# Patient Record
Sex: Male | Born: 1965 | Race: Asian | Hispanic: No | Marital: Single | Smoking: Former smoker
Health system: Southern US, Community
[De-identification: ages and names within clinical notes are randomized; demographics above are authoritative.]

## PROBLEM LIST (undated history)

## (undated) DIAGNOSIS — E119 Type 2 diabetes mellitus without complications: Secondary | ICD-10-CM

## (undated) DIAGNOSIS — I10 Essential (primary) hypertension: Secondary | ICD-10-CM

## (undated) HISTORY — DX: Essential (primary) hypertension: I10

## (undated) HISTORY — DX: Type 2 diabetes mellitus without complications: E11.9

## (undated) HISTORY — PX: NO PAST SURGERIES: SHX2092

---

## 2019-04-05 ENCOUNTER — Ambulatory Visit: Payer: Self-pay | Admitting: Family Medicine

## 2019-06-20 ENCOUNTER — Encounter: Payer: Self-pay | Admitting: Family Medicine

## 2019-06-20 ENCOUNTER — Other Ambulatory Visit: Payer: Self-pay

## 2019-06-20 ENCOUNTER — Ambulatory Visit (INDEPENDENT_AMBULATORY_CARE_PROVIDER_SITE_OTHER): Payer: BLUE CROSS/BLUE SHIELD | Admitting: Family Medicine

## 2019-06-20 VITALS — BP 123/75 | HR 91 | Temp 98.2°F | Ht 67.0 in | Wt 159.8 lb

## 2019-06-20 DIAGNOSIS — E119 Type 2 diabetes mellitus without complications: Secondary | ICD-10-CM | POA: Diagnosis not present

## 2019-06-20 DIAGNOSIS — I10 Essential (primary) hypertension: Secondary | ICD-10-CM | POA: Diagnosis not present

## 2019-06-20 DIAGNOSIS — E663 Overweight: Secondary | ICD-10-CM | POA: Diagnosis not present

## 2019-06-20 NOTE — Progress Notes (Signed)
Patient: Alexander Mercado, Male    DOB: Jul 10, 1966, 53 y.o.   MRN: [id number] Visit Date: 06/24/2019  Today's Provider: Lavon Paganini, MD   Chief Complaint  Patient presents with  . New Patient (Initial Visit)   Subjective:    I, Alexander Mercado CMA, am acting as a scribe for Lavon Paganini, MD.    New Patient: Alexander Mercado is a 53 y.o. male who presents today to Establish Care.  He feels fairly well. He reports exercising daily. He reports he is sleeping fairly well.  ----------------------------------------------------------------- Previously seeing a PCP in [place], [place]  T2DM - Checking BG at home: fastings, 110-115 - Medications: Metformin 500mg  BID - Compliance: good - Diet: low carb - eye exam: about 6 months ago - needs local Opth - foot exam: needs - microalbumin: n/a ACEi - denies symptoms of hypoglycemia, polyuria, polydipsia, numbness extremities, foot ulcers/trauma  HTN: - Medications: lisinopril 10mg  daily - Compliance: good - Checking BP at home: 120s/80 - Denies any SOB, CP, vision changes, LE edema, medication SEs, or symptoms of hypotension - Diet: low sodium  Started simvastatin Had dizziness  Stopped after 1-2 weeks Was not sure why he was Rx'd this because he was told that his HDL was low but also his LDL was good  Review of Systems  Constitutional: Negative.   HENT: Negative.   Eyes: Negative.   Respiratory: Negative.   Cardiovascular: Negative.   Gastrointestinal: Negative.   Endocrine: Negative.   Genitourinary: Negative.   Musculoskeletal: Negative.   Skin: Negative.   Allergic/Immunologic: Negative.   Neurological: Negative.   Hematological: Negative.   Psychiatric/Behavioral: Negative.     Social History      He  reports that he quit smoking about 5 years ago. His smoking use included cigarettes. He has a 25.00 pack-year smoking history. He has never used smokeless tobacco. He reports current alcohol use of  about 2.0 standard drinks of alcohol per week. He reports that he does not use drugs.       Social History   Socioeconomic History  . Marital status: Single    Spouse name: Not on file  . Number of children: 3  . Years of education: Not on file  . Highest education level: Not on file  Occupational History  . Occupation: Advertising account planner  Social Needs  . Financial resource strain: Not on file  . Food insecurity    Worry: Not on file    Inability: Not on file  . Transportation needs    Medical: Not on file    Non-medical: Not on file  Tobacco Use  . Smoking status: Former Smoker    Packs/day: 1.00    Years: 25.00    Pack years: 25.00    Types: Cigarettes    Quit date: 06/19/2014    Years since quitting: 5.0  . Smokeless tobacco: Never Used  Substance and Sexual Activity  . Alcohol use: Yes    Alcohol/week: 2.0 standard drinks    Types: 2 Standard drinks or equivalent per week  . Drug use: Never  . Sexual activity: Yes    Partners: Female  Lifestyle  . Physical activity    Days per week: Not on file    Minutes per session: Not on file  . Stress: Not on file  Relationships  . Social Herbalist on phone: Not on file    Gets together: Not on file    Attends religious  service: Not on file    Active member of club or organization: Not on file    Attends meetings of clubs or organizations: Not on file    Relationship status: Not on file  Other Topics Concern  . Not on file  Social History Narrative  . Not on file    Past Medical History:  Diagnosis Date  . Diabetes mellitus without complication (HCC)   . Hypertension      Patient Active Problem List   Diagnosis Date Noted  . Type 2 diabetes mellitus without complication, without long-term current use of insulin (HCC) 06/20/2019  . Essential hypertension 06/20/2019  . Overweight 06/20/2019    Past Surgical History:  Procedure Laterality Date  . NO PAST SURGERIES      Family History         Family Status  Relation Name Status  . Mother  Alive  . Father  Deceased  . Neg Hx  (Not Specified)        His family history includes Diabetes in his father and mother; Hypertension in his father and mother; Kidney disease in his father. There is no history of Colon cancer, Prostate cancer, or Breast cancer.      No Known Allergies   Current Outpatient Medications:  .  lisinopril (ZESTRIL) 10 MG tablet, Take 10 mg by mouth once., Disp: , Rfl:  .  metFORMIN (GLUCOPHAGE) 500 MG tablet, Take 500 mg by mouth 2 (two) times daily., Disp: , Rfl:    Patient Care Team: Erasmo DownerBacigalupo, Jerick Khachatryan M, MD as PCP - General (Family Medicine)    Objective:    Vitals: BP 123/75 (BP Location: Left Arm, Patient Position: Sitting, Cuff Size: Normal)   Pulse 91   Temp 98.2 F (36.8 C) (Oral)   Ht 5\' 7"  (1.702 m)   Wt 159 lb 12.8 oz (72.5 kg)   BMI 25.03 kg/m    Vitals:   06/20/19 1336  BP: 123/75  Pulse: 91  Temp: 98.2 F (36.8 C)  TempSrc: Oral  Weight: 159 lb 12.8 oz (72.5 kg)  Height: 5\' 7"  (1.702 m)     Physical Exam Vitals signs reviewed.  Constitutional:      General: He is not in acute distress.    Appearance: Normal appearance. He is well-developed. He is not diaphoretic.  HENT:     Head: Normocephalic and atraumatic.     Right Ear: Tympanic membrane, ear canal and external ear normal.     Left Ear: Tympanic membrane, ear canal and external ear normal.  Eyes:     General: No scleral icterus.    Conjunctiva/sclera: Conjunctivae normal.     Pupils: Pupils are equal, round, and reactive to light.  Neck:     Musculoskeletal: Neck supple.     Thyroid: No thyromegaly.  Cardiovascular:     Rate and Rhythm: Normal rate and regular rhythm.     Pulses: Normal pulses.     Heart sounds: Normal heart sounds. No murmur.  Pulmonary:     Effort: Pulmonary effort is normal. No respiratory distress.     Breath sounds: Normal breath sounds. No wheezing or rales.  Abdominal:     General:  There is no distension.     Palpations: Abdomen is soft.     Tenderness: There is no abdominal tenderness.  Musculoskeletal:        General: No deformity.     Right lower leg: No edema.     Left lower leg: No edema.  Lymphadenopathy:     Cervical: No cervical adenopathy.  Skin:    General: Skin is warm and dry.     Capillary Refill: Capillary refill takes less than 2 seconds.     Findings: No rash.  Neurological:     Mental Status: He is alert and oriented to person, place, and time. Mental status is at baseline.  Psychiatric:        Mood and Affect: Mood normal.        Behavior: Behavior normal.        Thought Content: Thought content normal.      Depression Screen PHQ 2/9 Scores 06/20/2019  PHQ - 2 Score 0  PHQ- 9 Score 1       Assessment & Plan:     Establish Care  Exercise Activities and Dietary recommendations Goals   None      There is no immunization history on file for this patient.  Health Maintenance  Topic Date Due  . PNEUMOCOCCAL POLYSACCHARIDE VACCINE AGE 51-64 HIGH RISK  11/15/1966  . FOOT EXAM  11/15/1974  . OPHTHALMOLOGY EXAM  11/15/1974  . HIV Screening  11/16/1979  . TETANUS/TDAP  11/16/1983  . COLONOSCOPY  11/15/2014  . INFLUENZA VACCINE  06/15/2019  . HEMOGLOBIN A1C  12/22/2019     Discussed health benefits of physical activity, and encouraged him to engage in regular exercise appropriate for his age and condition.    --------------------------------------------------------------------   Problem List Items Addressed This Visit      Cardiovascular and Mediastinum   Essential hypertension    Well controlled Continue current medications Recheck metabolic panel F/u in 6 months       Relevant Medications   lisinopril (ZESTRIL) 10 MG tablet   Other Relevant Orders   Comprehensive metabolic panel (Completed)     Endocrine   Type 2 diabetes mellitus without complication, without long-term current use of insulin (HCC) - Primary     Reportedly previously well controlled Recheck A1c Will get records of HCM and last eye exam Continue Metformin at current dose Will likely need a statin      Relevant Medications   metFORMIN (GLUCOPHAGE) 500 MG tablet   lisinopril (ZESTRIL) 10 MG tablet   Other Relevant Orders   Ambulatory referral to Ophthalmology   Lipid panel (Completed)   Comprehensive metabolic panel (Completed)   Hemoglobin A1c (Completed)     Other   Overweight    Discussed importance of healthy weight management Discussed diet and exercise       Relevant Orders   Lipid panel (Completed)   Comprehensive metabolic panel (Completed)       Return in about 3 months (around 09/20/2019) for chronic disease f/u.   The entirety of the information documented in the History of Present Illness, Review of Systems and Physical Exam were personally obtained by me. Portions of this information were initially documented by [facility] Merc[facility], CMA and reviewed by me for thoroughness and accuracy.    Melisssa Donner, Marzella SchleinAngela M, MD MPH [facility]

## 2019-06-20 NOTE — Assessment & Plan Note (Signed)
Discussed importance of healthy weight management Discussed diet and exercise  

## 2019-06-22 LAB — COMPREHENSIVE METABOLIC PANEL
ALT: 16 IU/L (ref 0–44)
AST: 16 IU/L (ref 0–40)
Albumin/Globulin Ratio: 1.8 (ref 1.2–2.2)
Albumin: 4.2 g/dL (ref 3.8–4.9)
Alkaline Phosphatase: 62 IU/L (ref 39–117)
BUN/Creatinine Ratio: 21 — ABNORMAL HIGH (ref 9–20)
BUN: 21 mg/dL (ref 6–24)
Bilirubin Total: 0.4 mg/dL (ref 0.0–1.2)
CO2: 22 mmol/L (ref 20–29)
Calcium: 8.9 mg/dL (ref 8.7–10.2)
Chloride: 102 mmol/L (ref 96–106)
Creatinine, Ser: 1.02 mg/dL (ref 0.76–1.27)
GFR calc Af Amer: 96 mL/min/{1.73_m2} (ref >59)
GFR calc non Af Amer: 83 mL/min/{1.73_m2} (ref >59)
Globulin, Total: 2.3 g/dL (ref 1.5–4.5)
Glucose: 113 mg/dL — ABNORMAL HIGH (ref 65–99)
Potassium: 4.4 mmol/L (ref 3.5–5.2)
Sodium: 139 mmol/L (ref 134–144)
Total Protein: 6.5 g/dL (ref 6.0–8.5)

## 2019-06-22 LAB — LIPID PANEL
Chol/HDL Ratio: 6.6 ratio — ABNORMAL HIGH (ref 0.0–5.0)
Cholesterol, Total: 205 mg/dL — ABNORMAL HIGH (ref 100–199)
HDL: 31 mg/dL — ABNORMAL LOW (ref >39)
LDL Calculated: 99 mg/dL (ref 0–99)
Triglycerides: 376 mg/dL — ABNORMAL HIGH (ref 0–149)
VLDL Cholesterol Cal: 75 mg/dL — ABNORMAL HIGH (ref 5–40)

## 2019-06-22 LAB — HEMOGLOBIN A1C
Est. average glucose Bld gHb Est-mCnc: 143 mg/dL
Hgb A1c MFr Bld: 6.6 % — ABNORMAL HIGH (ref 4.8–5.6)

## 2019-06-24 ENCOUNTER — Other Ambulatory Visit: Payer: Self-pay

## 2019-06-24 NOTE — Telephone Encounter (Signed)
LVMTRC 

## 2019-06-24 NOTE — Assessment & Plan Note (Signed)
Reportedly previously well controlled Recheck A1c Will get records of HCM and last eye exam Continue Metformin at current dose Will likely need a statin

## 2019-06-24 NOTE — Assessment & Plan Note (Signed)
Well controlled Continue current medications Recheck metabolic panel F/u in 6 months  

## 2019-06-24 NOTE — Telephone Encounter (Signed)
-----   Message from Virginia Crews, MD sent at 06/24/2019 12:20 PM EDT ----- A1c is well controlled. Continue current medications.  Cholesterol is not to goal for diabetics.  Recommend Atorvastatin 20mg  daily. OK to eRx #30 r3 if patient agrees. Recheck in 3 months.

## 2019-06-27 MED ORDER — ATORVASTATIN CALCIUM 20 MG PO TABS: 20.0000 mg | ORAL_TABLET | Freq: Every day | ORAL | 3 refills | Status: DC

## 2019-06-27 MED ORDER — METFORMIN HCL 500 MG PO TABS: 500.0000 mg | ORAL_TABLET | Freq: Two times a day (BID) | ORAL | 3 refills | Status: DC

## 2019-06-27 MED ORDER — LISINOPRIL 10 MG PO TABS: 10.0000 mg | ORAL_TABLET | Freq: Every day | ORAL | 3 refills | Status: DC

## 2019-06-27 NOTE — Telephone Encounter (Signed)
Patient was advised of results and agrees with starting Atorvastatin 20 MG. Patient is requesting a refill on his Lisinopril 10 MG & Metformin 500 MG. Please advise.

## 2019-09-20 ENCOUNTER — Ambulatory Visit (INDEPENDENT_AMBULATORY_CARE_PROVIDER_SITE_OTHER): Payer: BLUE CROSS/BLUE SHIELD | Admitting: Family Medicine

## 2019-09-20 ENCOUNTER — Encounter: Payer: Self-pay | Admitting: Family Medicine

## 2019-09-20 ENCOUNTER — Other Ambulatory Visit: Payer: Self-pay

## 2019-09-20 VITALS — BP 138/85 | HR 65 | Temp 98.2°F | Wt 161.0 lb

## 2019-09-20 DIAGNOSIS — I10 Essential (primary) hypertension: Secondary | ICD-10-CM | POA: Diagnosis not present

## 2019-09-20 DIAGNOSIS — E1169 Type 2 diabetes mellitus with other specified complication: Secondary | ICD-10-CM | POA: Diagnosis not present

## 2019-09-20 DIAGNOSIS — E663 Overweight: Secondary | ICD-10-CM

## 2019-09-20 DIAGNOSIS — E785 Hyperlipidemia, unspecified: Secondary | ICD-10-CM | POA: Diagnosis not present

## 2019-09-20 NOTE — Assessment & Plan Note (Signed)
New problem Recently started on atorvastatin Tolerating well-we will continue Recheck CMP and FLP Goal LDL less than 70

## 2019-09-20 NOTE — Assessment & Plan Note (Signed)
Well controlled with last A1c 6.6 Continue current medications Has upcoming eye exam Foot exam today Discussed recommended vaccines On ACEi On Statin Discussed diet and exercise F/u in 6 months

## 2019-09-20 NOTE — Patient Instructions (Addendum)
Talk to your wife about about Pneumonia and tetanus vaccines

## 2019-09-20 NOTE — Progress Notes (Signed)
Patient: Alexander Mercado Male    DOB: [birth year]   53 y.o.   MRN: [id number] Visit Date: 09/20/2019  Today's Provider: Lavon Paganini, MD   Chief Complaint  Patient presents with  . Diabetes  . Hypertension  . Hyperlipidemia   Subjective:     HPI     Diabetes Mellitus Type II, Follow-up:   Lab Results  Component Value Date   HGBA1C 6.6 (H) 06/21/2019   Last seen for diabetes 3 months ago.  Management since then includes No changes. He reports excellent compliance with treatment. He is not having side effects.  Current symptoms include none and have been stable. Home blood sugar records: 110's  Episodes of hypoglycemia? no   Current Insulin Regimen: None Most Recent Eye Exam: unknown - has appt scheduled Weight trend: stable Current diet: in general, a "healthy" diet   Current exercise: Regularly  ------------------------------------------------------------------------   Hypertension, follow-up:  BP Readings from Last 3 Encounters:  09/20/19 138/85  06/20/19 123/75    He was last seen for hypertension 3 months ago.  BP at that visit was 123/75. Management since that visit includes no changes. He reports excellent compliance with treatment. He is not having side effects.  He is exercising. He is adherent to low salt diet.   Outside blood pressures are 120-130's/80-90's. He is experiencing none.  Patient denies chest pain, fatigue and palpitations.   Cardiovascular risk factors include diabetes mellitus, dyslipidemia, hypertension and male gender.  Use of agents associated with hypertension: none.   ------------------------------------------------------------------------    Lipid/Cholesterol, Follow-up:   Last seen for this 3 months ago.  Management since that visit includes Started Atorvastatin 20mg .  Last Lipid Panel:    Component Value Date/Time   CHOL 205 (H) 06/21/2019 0834   TRIG 376 (H) 06/21/2019 0834   HDL 31 (L) 06/21/2019 0834   CHOLHDL 6.6 (H) 06/21/2019 0834   LDLCALC 99 06/21/2019 0834    He reports excellent compliance with treatment. He is not having side effects.   Wt Readings from Last 3 Encounters:  09/20/19 161 lb (73 kg)  06/20/19 159 lb 12.8 oz (72.5 kg)    ------------------------------------------------------------------------    No Known Allergies   Current Outpatient Medications:  .  atorvastatin (LIPITOR) 20 MG tablet, Take 1 tablet (20 mg total) by mouth daily., Disp: 30 tablet, Rfl: 3 .  lisinopril (ZESTRIL) 10 MG tablet, Take 1 tablet (10 mg total) by mouth daily., Disp: 90 tablet, Rfl: 3 .  metFORMIN (GLUCOPHAGE) 500 MG tablet, Take 1 tablet (500 mg total) by mouth 2 (two) times daily., Disp: 180 tablet, Rfl: 3  Review of Systems  Constitutional: Negative.   Respiratory: Negative.   Cardiovascular: Negative.   Gastrointestinal: Negative.   Endocrine: Negative.   Neurological: Negative for dizziness, light-headedness and headaches.    Social History   Tobacco Use  . Smoking status: Former Smoker    Packs/day: 1.00    Years: 25.00    Pack years: 25.00    Types: Cigarettes    Quit date: 06/19/2014    Years since quitting: 5.2  . Smokeless tobacco: Never Used  Substance Use Topics  . Alcohol use: Yes    Alcohol/week: 2.0 standard drinks    Types: 2 Standard drinks or equivalent per week      Objective:   BP 138/85   Pulse 65   Temp 98.2 F (36.8 C) (Temporal)   Wt 161 lb (73 kg)   BMI 25.22 kg/m  Vitals:   09/20/19 0812 09/20/19 0840  BP: (!) 145/93 138/85  Pulse: 65   Temp: 98.2 F (36.8 C)   TempSrc: Temporal   Weight: 161 lb (73 kg)   Body mass index is 25.22 kg/m.   Physical Exam Vitals signs reviewed.  Constitutional:      General: He is not in acute distress.    Appearance: Normal appearance. He is not diaphoretic.  HENT:     Head: Normocephalic and atraumatic.  Eyes:     General: No scleral icterus.    Conjunctiva/sclera: Conjunctivae  normal.  Neck:     Musculoskeletal: Neck supple.  Cardiovascular:     Rate and Rhythm: Normal rate and regular rhythm.     Pulses: Normal pulses.     Heart sounds: Normal heart sounds. No murmur.  Pulmonary:     Effort: Pulmonary effort is normal. No respiratory distress.     Breath sounds: Normal breath sounds. No wheezing or rhonchi.  Abdominal:     General: There is no distension.     Palpations: Abdomen is soft.     Tenderness: There is no abdominal tenderness.  Musculoskeletal:     Right lower leg: No edema.     Left lower leg: No edema.  Lymphadenopathy:     Cervical: No cervical adenopathy.  Skin:    General: Skin is warm and dry.     Capillary Refill: Capillary refill takes less than 2 seconds.     Findings: No rash.  Neurological:     Mental Status: He is alert and oriented to person, place, and time. Mental status is at baseline.     Cranial Nerves: No cranial nerve deficit.  Psychiatric:        Mood and Affect: Mood normal.        Behavior: Behavior normal.     Diabetic Foot Exam - Simple   Simple Foot Form Diabetic Foot exam was performed with the following findings: Yes 09/20/2019  8:40 AM  Visual Inspection No deformities, no ulcerations, no other skin breakdown bilaterally: Yes Sensation Testing Intact to touch and monofilament testing bilaterally: Yes Pulse Check Posterior Tibialis and Dorsalis pulse intact bilaterally: Yes Comments      No results found for any visits on 09/20/19.     Assessment & Plan   Problem List Items Addressed This Visit      Cardiovascular and Mediastinum   Essential hypertension - Primary    Initially elevated, but well controlled on manual recheck Continue current medications Recheck metabolic panel F/u in 6 months         Endocrine   Type 2 diabetes mellitus without complication, without long-term current use of insulin (HCC)    Well controlled with last A1c 6.6 Continue current medications Has upcoming eye  exam Foot exam today Discussed recommended vaccines On ACEi On Statin Discussed diet and exercise F/u in 6 months       Hyperlipidemia associated with type 2 diabetes mellitus (HCC)    New problem Recently started on atorvastatin Tolerating well-we will continue Recheck CMP and FLP Goal LDL less than 70      Relevant Orders   CMP (Comprehensive metabolic panel)   Lipid panel     Other   Overweight    Discussed importance of healthy weight management Discussed diet and exercise         Patient will attempt to get records from [place].  He  states that he often gets his health maintenance done when he returns to [place] as it is significantly more cost effective   Return in about 6 months (around 03/19/2020) for CPE.   The entirety of the information documented in the History of Present Illness, Review of Systems and Physical Exam were personally obtained by me. Portions of this information were initially documented by Kavin Leech, CMA and reviewed by me for thoroughness and accuracy.    Jamar Weatherall, Marzella Schlein, MD MPH [facility]

## 2019-09-20 NOTE — Assessment & Plan Note (Signed)
Discussed importance of healthy weight management Discussed diet and exercise  

## 2019-09-20 NOTE — Assessment & Plan Note (Signed)
Initially elevated, but well controlled on manual recheck Continue current medications Recheck metabolic panel F/u in 6 months 

## 2019-09-21 LAB — COMPREHENSIVE METABOLIC PANEL
ALT: 16 IU/L (ref 0–44)
AST: 15 IU/L (ref 0–40)
Albumin/Globulin Ratio: 2.2 (ref 1.2–2.2)
Albumin: 4.6 g/dL (ref 3.8–4.9)
Alkaline Phosphatase: 64 IU/L (ref 39–117)
BUN/Creatinine Ratio: 19 (ref 9–20)
BUN: 18 mg/dL (ref 6–24)
Bilirubin Total: 0.3 mg/dL (ref 0.0–1.2)
CO2: 21 mmol/L (ref 20–29)
Calcium: 8.9 mg/dL (ref 8.7–10.2)
Chloride: 104 mmol/L (ref 96–106)
Creatinine, Ser: 0.97 mg/dL (ref 0.76–1.27)
GFR calc Af Amer: 102 mL/min/{1.73_m2} (ref >59)
GFR calc non Af Amer: 88 mL/min/{1.73_m2} (ref >59)
Globulin, Total: 2.1 g/dL (ref 1.5–4.5)
Glucose: 124 mg/dL — ABNORMAL HIGH (ref 65–99)
Potassium: 4.4 mmol/L (ref 3.5–5.2)
Sodium: 140 mmol/L (ref 134–144)
Total Protein: 6.7 g/dL (ref 6.0–8.5)

## 2019-09-21 LAB — LIPID PANEL
Chol/HDL Ratio: 4.1 ratio (ref 0.0–5.0)
Cholesterol, Total: 135 mg/dL (ref 100–199)
HDL: 33 mg/dL — ABNORMAL LOW (ref >39)
LDL Chol Calc (NIH): 64 mg/dL (ref 0–99)
Triglycerides: 234 mg/dL — ABNORMAL HIGH (ref 0–149)
VLDL Cholesterol Cal: 38 mg/dL (ref 5–40)

## 2019-09-21 LAB — HEMOGLOBIN A1C
Est. average glucose Bld gHb Est-mCnc: 148 mg/dL
Hgb A1c MFr Bld: 6.8 % — ABNORMAL HIGH (ref 4.8–5.6)

## 2019-09-23 ENCOUNTER — Telehealth: Payer: Self-pay

## 2019-09-23 NOTE — Telephone Encounter (Signed)
Viewed by Otho Perl on 09/23/2019 8:14 AM Written by Virginia Crews, MD on 09/23/2019 8:11 AM Normal kidney function, liver function, electrolytes. Cholesterol and A1c controlled. No changes to medications

## 2019-11-09 ENCOUNTER — Other Ambulatory Visit: Payer: Self-pay | Admitting: Family Medicine

## 2019-11-20 ENCOUNTER — Other Ambulatory Visit: Payer: Self-pay | Admitting: Family Medicine

## 2019-12-02 LAB — HM DIABETES EYE EXAM

## 2019-12-04 ENCOUNTER — Other Ambulatory Visit: Payer: Self-pay

## 2019-12-04 ENCOUNTER — Ambulatory Visit
Admission: RE | Admit: 2019-12-04 | Discharge: 2019-12-04 | Disposition: A | Discharge status: Home / Self Care | Payer: BLUE CROSS/BLUE SHIELD | Source: Ambulatory Visit | Attending: Family Medicine | Admitting: Family Medicine

## 2019-12-04 ENCOUNTER — Telehealth (INDEPENDENT_AMBULATORY_CARE_PROVIDER_SITE_OTHER): Payer: BLUE CROSS/BLUE SHIELD | Admitting: Family Medicine

## 2019-12-04 ENCOUNTER — Encounter: Payer: Self-pay | Admitting: Family Medicine

## 2019-12-04 ENCOUNTER — Telehealth: Payer: Self-pay

## 2019-12-04 VITALS — BP 148/102 | Ht 68.0 in | Wt 160.0 lb

## 2019-12-04 DIAGNOSIS — G8929 Other chronic pain: Secondary | ICD-10-CM

## 2019-12-04 DIAGNOSIS — I10 Essential (primary) hypertension: Secondary | ICD-10-CM

## 2019-12-04 DIAGNOSIS — W11XXXA Fall on and from ladder, initial encounter: Secondary | ICD-10-CM

## 2019-12-04 DIAGNOSIS — M542 Cervicalgia: Secondary | ICD-10-CM

## 2019-12-04 DIAGNOSIS — M5442 Lumbago with sciatica, left side: Secondary | ICD-10-CM

## 2019-12-04 DIAGNOSIS — G44329 Chronic post-traumatic headache, not intractable: Secondary | ICD-10-CM | POA: Diagnosis not present

## 2019-12-04 DIAGNOSIS — M5441 Lumbago with sciatica, right side: Secondary | ICD-10-CM

## 2019-12-04 MED ORDER — CYCLOBENZAPRINE HCL 5 MG PO TABS: 5.0000 mg | ORAL_TABLET | Freq: Three times a day (TID) | ORAL | 1 refills | Status: DC | PRN

## 2019-12-04 MED ORDER — LISINOPRIL 20 MG PO TABS: 20.0000 mg | ORAL_TABLET | Freq: Every day | ORAL | 3 refills | Status: DC

## 2019-12-04 MED ORDER — MELOXICAM 15 MG PO TABS: 15.0000 mg | ORAL_TABLET | Freq: Every day | ORAL | 0 refills | Status: DC

## 2019-12-04 MED ORDER — ATORVASTATIN CALCIUM 20 MG PO TABS: 20.0000 mg | ORAL_TABLET | Freq: Every day | ORAL | 3 refills | Status: DC

## 2019-12-04 NOTE — Progress Notes (Signed)
Patient: Alexander Mercado Male    DOB: 1966/04/06   54 y.o.   MRN: [id number] Visit Date: 12/04/2019  Today's Provider: Shirlee Latch, MD   Chief Complaint  Patient presents with  . Headache  . Back Pain  . Neck Pain  . Hypertension   Subjective:    Virtual Visit via Video Note  I connected with Caryn Bee on 12/04/19 at  9:20 AM EST by a video enabled telemedicine application and verified that I am speaking with the correct person using two identifiers.  Location: Patient location: home Provider location: [facility] Persons involved in the visit: patient, provider   I discussed the limitations of evaluation and management by telemedicine and the availability of in person appointments. The patient expressed understanding and agreed to proceed.  HPI Patient C/O recurrent and worsening back pain x's several months. Patient reports he fell about 1 month ago from a ladder at home.  He fell about 6 feet and landed on his abdomen and hit his forehead..  Patient reports back pain radiates to legs.  He denies any weakness or numbness in his legs, though.  He reports that the pain is sharp.  The back pain did predate the fall, but the leg pain is new.  Patient reports taking Tylenol and reports mild symptom control.   Patient C/O recurrent neck pain x's 1 month, since his fall off the ladder. Patient reports pain is about the same day and night.  Denies any radicular symptoms in his arms.  Has some loss of lateral rotation range of motion in both directions.  He is also been having occasional headaches since his fall.  No vision changes, slurred speech, facial droop, or other neurologic symptoms.  This seems to be getting better overall.  Patient reports blood pressure is elevated in the 160's, reports blood pressure has been elevated since his fall. Patient reports headache, but denies any chest pain, shortness of breath or swelling.   No Known Allergies   Current  Outpatient Medications:  .  atorvastatin (LIPITOR) 20 MG tablet, Take 1 tablet (20 mg total) by mouth daily., Disp: 90 tablet, Rfl: 3 .  lisinopril (ZESTRIL) 20 MG tablet, Take 1 tablet (20 mg total) by mouth daily., Disp: 90 tablet, Rfl: 3 .  metFORMIN (GLUCOPHAGE) 500 MG tablet, Take 1 tablet (500 mg total) by mouth 2 (two) times daily., Disp: 180 tablet, Rfl: 3 .  cyclobenzaprine (FLEXERIL) 5 MG tablet, Take 1 tablet (5 mg total) by mouth 3 (three) times daily as needed for muscle spasms., Disp: 30 tablet, Rfl: 1 .  meloxicam (MOBIC) 15 MG tablet, Take 1 tablet (15 mg total) by mouth daily., Disp: 30 tablet, Rfl: 0  Review of Systems  Constitutional: Negative for chills and fatigue.  Eyes: Negative for visual disturbance.  Respiratory: Negative for cough, chest tightness, shortness of breath and wheezing.   Cardiovascular: Negative for chest pain, palpitations and leg swelling.  Musculoskeletal: Positive for back pain, myalgias and neck pain.  Neurological: Positive for headaches.    Social History   Tobacco Use  . Smoking status: Former Smoker    Packs/day: 1.00    Years: 25.00    Pack years: 25.00    Types: Cigarettes    Quit date: 06/19/2014    Years since quitting: 5.4  . Smokeless tobacco: Never Used  Substance Use Topics  . Alcohol use: Yes    Alcohol/week: 2.0 standard drinks    Types: 2  Standard drinks or equivalent per week      Objective:   BP (!) 148/102 (BP Location: Left Arm, Patient Position: Sitting, Cuff Size: Normal)   Ht 5\' 8"  (1.727 m)   Wt 160 lb (72.6 kg)   BMI 24.33 kg/m  Vitals:   12/04/19 0814  BP: (!) 148/102  Weight: 160 lb (72.6 kg)  Height: 5\' 8"  (1.727 m)  Body mass index is 24.33 kg/m.   Physical Exam Vitals reviewed.  Constitutional:      General: He is not in acute distress.    Appearance: He is well-developed. He is not diaphoretic.  HENT:     Head: Normocephalic and atraumatic.  Pulmonary:     Effort: Pulmonary effort is  normal. No respiratory distress.  Neurological:     Mental Status: He is alert and oriented to person, place, and time.     Cranial Nerves: No dysarthria or facial asymmetry.  Psychiatric:        Mood and Affect: Mood normal.        Behavior: Behavior normal.      No results found for any visits on 12/04/19.     Assessment & Plan    I discussed the assessment and treatment plan with the patient. The patient was provided an opportunity to ask questions and all were answered. The patient agreed with the plan and demonstrated an understanding of the instructions.   The patient was advised to call back or seek an in-person evaluation if the symptoms worsen or if the condition fails to improve as anticipated.  1. Chronic bilateral low back pain with bilateral sciatica 2. Neck pain 3. Fall from ladder, initial encounter 4. Chronic post-traumatic headache, not intractable -Back pain predated his fall from the ladder, but it was worsened by his fall from the ladder -He is now also experiencing neck pain, radiation of his back pain into bilateral legs, and headaches since his fall -He did hit his head when he fell, but given that this was a month ago and he is otherwise neuro intact, there is good evidence that he does not have any intracranial bleeding from this -He likely suffered a concussion -Should avoid excessive screen time or eyestrain to help with headaches -Does not seem to have any radicular symptoms from his back pain or neck pain, which is reassuring -Given the mechanism of injury, however we will obtain x-rays of complete spine to ensure no fractures -Likely some muscle spasm after the trauma which is contributing to his ongoing pain, so we will try Flexeril and meloxicam as needed -Discussed possible side effects -Further work-up/management pending x-ray results -Discussed return precautions - DG Lumbar Spine Complete; Future - DG Thoracic Spine W/Swimmers; Future - DG  Cervical Spine Complete; Future   5. Essential hypertension -Chronic and uncontrolled Continue to monitor home blood pressures -May be contributed to by his pain after his recent fall -We will increase his lisinopril to 20 mg daily -If still elevated at his follow-up, consider further dose increase -Repeat metabolic panel at next visit -Discussed return precautions and red flag signs   Meds ordered this encounter  Medications  . lisinopril (ZESTRIL) 20 MG tablet    Sig: Take 1 tablet (20 mg total) by mouth daily.    Dispense:  90 tablet    Refill:  3  . atorvastatin (LIPITOR) 20 MG tablet    Sig: Take 1 tablet (20 mg total) by mouth daily.    Dispense:  90 tablet  Refill:  3  . cyclobenzaprine (FLEXERIL) 5 MG tablet    Sig: Take 1 tablet (5 mg total) by mouth 3 (three) times daily as needed for muscle spasms.    Dispense:  30 tablet    Refill:  1  . meloxicam (MOBIC) 15 MG tablet    Sig: Take 1 tablet (15 mg total) by mouth daily.    Dispense:  30 tablet    Refill:  0     Return in about 4 weeks (around 01/01/2020) for BP f/u.   The entirety of the information documented in the History of Present Illness, Review of Systems and Physical Exam were personally obtained by me. Portions of this information were initially documented by Rondel Baton, CMA and reviewed by me for thoroughness and accuracy.    Melisse Caetano, Marzella Schlein, MD MPH [facility]

## 2019-12-04 NOTE — Telephone Encounter (Signed)
Seen by patient Alexander Mercado on 12/04/2019 3:01 PM EST

## 2019-12-04 NOTE — Patient Instructions (Signed)
Concussion, Adult  A concussion is a brain injury from a hard, direct hit (trauma) to your head or body. This direct hit causes the brain to quickly shake back and forth inside the skull. A concussion may also be called a mild traumatic brain injury (TBI). Healing from this injury can take time. What are the causes? This condition is caused by:  A direct hit to your head, such as: ? Running into a player during a game. ? Being hit in a fight. ? Hitting your head on a hard surface.  A quick and sudden movement (jolt) of the head or neck, such as in a car crash. What are the signs or symptoms? The signs of a concussion can be hard to notice. They may be missed by you, family members, and doctors. You may look fine on the outside but may not act or feel normal. Physical symptoms  Headaches.  Being tired (fatigued).  Being dizzy.  Problems with body balance.  Problems seeing or hearing.  Being sensitive to light or noise.  Feeling sick to your stomach (nausea) or throwing up (vomiting).  Not sleeping or eating as you used to.  Loss of feeling (numbness) or tingling in the body.  Seizure. Mental and emotional symptoms  Problems remembering things.  Trouble focusing your mind (concentrating), organizing, or making decisions.  Being slow to think, act, react, speak, or read.  Feeling grouchy (irritable).  Having mood changes.  Feeling worried or nervous (anxious).  Feeling sad (depressed). How is this treated? This condition may be treated by:  Stopping sports or activity if you are injured. If you hit your head or have signs of concussion: ? Do not return to sports or activities the same day. ? Get checked by a doctor before you return to your activities.  Resting your body and your mind.  Being watched carefully, often at home.  Medicines to help with symptoms such as: ? Feeling sick to your stomach. ? Headaches. ? Problems with sleep.  Avoid taking strong  pain medicines (opioids) for a concussion.  Avoiding alcohol and drugs.  Being asked to go to a concussion clinic or a place to help you recover (rehabilitation center). Recovery from a concussion can take time. Return to activities only:  When you are fully healed.  When your doctor says it is safe. Follow these instructions at home: Activity  Limit activities that need a lot of thought or focus, such as: ? Homework or work for your job. ? Watching TV. ? Using the computer or phone. ? Playing memory games and puzzles.  Rest. Rest helps your brain heal. Make sure you: ? Get plenty of sleep. Most adults should get 7-9 hours of sleep each night. ? Rest during the day. Take naps or breaks when you feel tired.  Avoid activity like exercise until your doctor says its safe. Stop any activity that makes symptoms worse.  Do not do activities that could cause a second concussion, such as riding a bike or playing sports.  Ask your doctor when you can return to your normal activities, such as school, work, sports, and driving. Your ability to react may be slower. Do not do these activities if you are dizzy. General instructions   Take over-the-counter and prescription medicines only as told by your doctor.  Do not drink alcohol until your doctor says you can.  Watch your symptoms and tell other people to do the same. Other problems can occur after a concussion. Older   adults have a higher risk of serious problems.  Tell your work manager, teachers, school nurse, school counselor, coach, or athletic trainer about your injury and symptoms. Tell them about what you can or cannot do.  Keep all follow-up visits as told by your doctor. This is important. How is this prevented?  It is very important that you do not get another brain injury. In rare cases, another injury can cause brain damage that will not go away, brain swelling, or death. The risk of this is greatest in the first 7-10 days  after a head injury. To avoid injuries: ? Stop activities that could lead to a second concussion, such as contact sports, until your doctor says it is okay. ? When you return to sports or activities:  Do not crash into other players. This is how most concussions happen.  Follow the rules.  Respect other players. ? Get regular exercise. Do strength and balance training. ? Wear a helmet that fits you well during sports, biking, or other activities.  Helmets can help protect you from serious skull and brain injuries, but they do not protect you from a concussion. Even when wearing a helmet, you should avoid being hit in the head. Contact a doctor if:  Your symptoms get worse or they do not get better.  You have new symptoms.  You have another injury. Get help right away if:  You have bad headaches or your headaches get worse.  You feel weak or numb in any part of your body.  You are mixed up (confused).  Your balance gets worse.  You keep throwing up.  You feel more sleepy than normal.  Your speech is not clear (is slurred).  You cannot recognize people or places.  You have a seizure.  Others have trouble waking you up.  You have behavior changes.  You have changes in how you see (vision).  You pass out (lose consciousness). Summary  A concussion is a brain injury from a hard, direct hit (trauma) to your head or body.  This condition is treated with rest and careful watching of symptoms.  If you keep having symptoms, call your doctor. This information is not intended to replace advice given to you by your health care provider. Make sure you discuss any questions you have with your health care provider. Document Revised: 06/21/2018 Document Reviewed: 06/21/2018 Elsevier Patient Education  2020 [employer]  

## 2019-12-04 NOTE — Telephone Encounter (Signed)
-----   Message from Erasmo Downer, MD sent at 12/04/2019  2:18 PM EST ----- X-rays of cervical, thoracic, lumbar spine are all reviewed.  They all show degenerative changes with no acute fractures or other abnormalities.  No change to plan as we discussed during his visit.

## 2020-01-06 ENCOUNTER — Ambulatory Visit: Payer: BLUE CROSS/BLUE SHIELD | Admitting: Family Medicine

## 2020-02-07 ENCOUNTER — Ambulatory Visit: Payer: BLUE CROSS/BLUE SHIELD | Attending: Internal Medicine

## 2020-02-07 DIAGNOSIS — Z23 Encounter for immunization: Secondary | ICD-10-CM

## 2020-02-07 NOTE — Progress Notes (Signed)
   Covid-19 Vaccination Clinic  Name:  Alexander Mercado    MRN: [id number] DOB: 12/07/1965  02/07/2020  [name] was observed post Covid-19 immunization for 15 minutes without incident. He was provided with Vaccine Information Sheet and instruction to access the V-Safe system.   [name] was instructed to call 911 with any severe reactions post vaccine: Marland Kitchen Difficulty breathing  . Swelling of face and throat  . A fast heartbeat  . A bad rash all over body  . Dizziness and weakness   Immunizations Administered    Name Date Dose VIS Date Route   Pfizer COVID-19 Vaccine 02/07/2020  2:30 PM 0.3 mL 10/25/2019 Intramuscular   Manufacturer: [employer], [employer]   Lot: [id number]   NDC: [address]-2

## 2020-03-03 ENCOUNTER — Ambulatory Visit: Payer: BLUE CROSS/BLUE SHIELD | Attending: Internal Medicine

## 2020-03-03 DIAGNOSIS — Z23 Encounter for immunization: Secondary | ICD-10-CM

## 2020-03-03 NOTE — Progress Notes (Signed)
   Covid-19 Vaccination Clinic  Name:  Alexander Mercado    MRN: [id number] DOB: 01/25/1966  03/03/2020  [name] was observed post Covid-19 immunization for 15 minutes without incident. He was provided with Vaccine Information Sheet and instruction to access the V-Safe system.   [name] was instructed to call 911 with any severe reactions post vaccine: Marland Kitchen Difficulty breathing  . Swelling of face and throat  . A fast heartbeat  . A bad rash all over body  . Dizziness and weakness   Immunizations Administered    Name Date Dose VIS Date Route   Pfizer COVID-19 Vaccine 03/03/2020 10:44 AM 0.3 mL 01/08/2019 Intramuscular   Manufacturer: [employer], [employer]   Lot: [id number]   NDC: [address]-3

## 2020-03-20 NOTE — Patient Instructions (Signed)
Preventive Care 41-55 Years Old, Male Preventive care refers to lifestyle choices and visits with your health care provider that can promote health and wellness. This includes:  A yearly physical exam. This is also called an annual well check.  Regular dental and eye exams.  Immunizations.  Screening for certain conditions.  Healthy lifestyle choices, such as eating a healthy diet, getting regular exercise, not using drugs or products that contain nicotine and tobacco, and limiting alcohol use. What can I expect for my preventive care visit? Physical exam Your health care provider will check:  Height and weight. These may be used to calculate body mass index (BMI), which is a measurement that tells if you are at a healthy weight.  Heart rate and blood pressure.  Your skin for abnormal spots. Counseling Your health care provider may ask you questions about:  Alcohol, tobacco, and drug use.  Emotional well-being.  Home and relationship well-being.  Sexual activity.  Eating habits.  Work and work Statistician. What immunizations do I need?  Influenza (flu) vaccine  This is recommended every year. Tetanus, diphtheria, and pertussis (Tdap) vaccine  You may need a Td booster every 10 years. Varicella (chickenpox) vaccine  You may need this vaccine if you have not already been vaccinated. Zoster (shingles) vaccine  You may need this after age 64. Measles, mumps, and rubella (MMR) vaccine  You may need at least one dose of MMR if you were born in 1957 or later. You may also need a second dose. Pneumococcal conjugate (PCV13) vaccine  You may need this if you have certain conditions and were not previously vaccinated. Pneumococcal polysaccharide (PPSV23) vaccine  You may need one or two doses if you smoke cigarettes or if you have certain conditions. Meningococcal conjugate (MenACWY) vaccine  You may need this if you have certain conditions. Hepatitis A  vaccine  You may need this if you have certain conditions or if you travel or work in places where you may be exposed to hepatitis A. Hepatitis B vaccine  You may need this if you have certain conditions or if you travel or work in places where you may be exposed to hepatitis B. Haemophilus influenzae type b (Hib) vaccine  You may need this if you have certain risk factors. Human papillomavirus (HPV) vaccine  If recommended by your health care provider, you may need three doses over 6 months. You may receive vaccines as individual doses or as more than one vaccine together in one shot (combination vaccines). Talk with your health care provider about the risks and benefits of combination vaccines. What tests do I need? Blood tests  Lipid and cholesterol levels. These may be checked every 5 years, or more frequently if you are over 60 years old.  Hepatitis C test.  Hepatitis B test. Screening  Lung cancer screening. You may have this screening every year starting at age 43 if you have a 30-pack-year history of smoking and currently smoke or have quit within the past 15 years.  Prostate cancer screening. Recommendations will vary depending on your family history and other risks.  Colorectal cancer screening. All adults should have this screening starting at age 72 and continuing until age 2. Your health care provider may recommend screening at age 14 if you are at increased risk. You will have tests every 1-10 years, depending on your results and the type of screening test.  Diabetes screening. This is done by checking your blood sugar (glucose) after you have not eaten  for a while (fasting). You may have this done every 1-3 years.  Sexually transmitted disease (STD) testing. Follow these instructions at home: Eating and drinking  Eat a diet that includes fresh fruits and vegetables, whole grains, lean protein, and low-fat dairy products.  Take vitamin and mineral supplements as  recommended by your health care provider.  Do not drink alcohol if your health care provider tells you not to drink.  If you drink alcohol: ? Limit how much you have to 0-2 drinks a day. ? Be aware of how much alcohol is in your drink. In the [place] one drink equals one 12 oz bottle of beer (355 mL), one 5 oz glass of wine (148 mL), or one 1 oz glass of hard liquor (44 mL). Lifestyle  Take daily care of your teeth and gums.  Stay active. Exercise for at least 30 minutes on 5 or more days each week.  Do not use any products that contain nicotine or tobacco, such as cigarettes, e-cigarettes, and chewing tobacco. If you need help quitting, ask your health care provider.  If you are sexually active, practice safe sex. Use a condom or other form of protection to prevent STIs (sexually transmitted infections).  Talk with your health care provider about taking a low-dose aspirin every day starting at age 67. What's next?  Go to your health care provider once a year for a well check visit.  Ask your health care provider how often you should have your eyes and teeth checked.  Stay up to date on all vaccines. This information is not intended to replace advice given to you by your health care provider. Make sure you discuss any questions you have with your health care provider. Document Revised: 10/25/2018 Document Reviewed: 10/25/2018 Elsevier Patient Education  2020 [employer].

## 2020-03-20 NOTE — Progress Notes (Signed)
Complete physical exam   Patient: Alexander Mercado   DOB: November 11, 1966   54 y.o. Male  MRN: [id number] Visit Date: 03/23/2020  I,Sulibeya S Dimas,acting as a scribe for Shirlee Latch, MD.,have documented all relevant documentation on the behalf of Shirlee Latch, MD,as directed by  Shirlee Latch, MD while in the presence of Shirlee Latch, MD.  Today's healthcare provider: Shirlee Latch, MD   Chief Complaint  Patient presents with  . Annual Exam   Subjective    Alexander Mercado is a 54 y.o. male who presents today for a complete physical exam.  He reports consuming a general diet. Home exercise routine includes walking 35 minutes hrs per days. He generally feels well. He reports sleeping well. He does have additional problems to discuss today.  HPI  Patient reports right shoulder pain. Patient not sure if Atorvastatin is causing pain.  Patient reports persistent back pain. Patient reports he did show his nephew who is a doctor, and he advised to continue doing exercise.   Past Medical History:  Diagnosis Date  . Diabetes mellitus without complication (HCC)   . Hypertension    Past Surgical History:  Procedure Laterality Date  . NO PAST SURGERIES     Social History   Socioeconomic History  . Marital status: Single    Spouse name: Not on file  . Number of children: 3  . Years of education: Not on file  . Highest education level: Not on file  Occupational History  . Occupation: Research officer, trade union  Tobacco Use  . Smoking status: Former Smoker    Packs/day: 1.00    Years: 25.00    Pack years: 25.00    Types: Cigarettes    Quit date: 06/19/2014    Years since quitting: 5.7  . Smokeless tobacco: Never Used  Substance and Sexual Activity  . Alcohol use: Yes    Alcohol/week: 2.0 standard drinks    Types: 2 Standard drinks or equivalent per week  . Drug use: Never  . Sexual activity: Yes    Partners: Female  Other Topics Concern  . Not on file  Social History  Narrative  . Not on file   Social Determinants of Health   Financial Resource Strain:   . Difficulty of Paying Living Expenses:   Food Insecurity:   . Worried About Programme researcher, broadcasting/film/video in the Last Year:   . Barista in the Last Year:   Transportation Needs:   . Freight forwarder (Medical):   Marland Kitchen Lack of Transportation (Non-Medical):   Physical Activity:   . Days of Exercise per Week:   . Minutes of Exercise per Session:   Stress:   . Feeling of Stress :   Social Connections:   . Frequency of Communication with Friends and Family:   . Frequency of Social Gatherings with Friends and Family:   . Attends Religious Services:   . Active Member of Clubs or Organizations:   . Attends Banker Meetings:   Marland Kitchen Marital Status:   Intimate Partner Violence:   . Fear of Current or Ex-Partner:   . Emotionally Abused:   Marland Kitchen Physically Abused:   . Sexually Abused:    Family Status  Relation Name Status  . Mother  Alive  . Father  Deceased  . Neg Hx  (Not Specified)   Family History  Problem Relation Age of Onset  . Hypertension Mother   . Diabetes Mother   . Diabetes Father   .  Hypertension Father   . Kidney disease Father   . Colon cancer Neg Hx   . Prostate cancer Neg Hx   . Breast cancer Neg Hx    No Known Allergies  Patient Care Team: Erasmo Downer, MD as PCP - General (Family Medicine)   Medications: Outpatient Medications Prior to Visit  Medication Sig  . atorvastatin (LIPITOR) 20 MG tablet Take 1 tablet (20 mg total) by mouth daily.  Marland Kitchen lisinopril (ZESTRIL) 20 MG tablet Take 1 tablet (20 mg total) by mouth daily.  . meloxicam (MOBIC) 15 MG tablet Take 1 tablet (15 mg total) by mouth daily.  . metFORMIN (GLUCOPHAGE) 500 MG tablet Take 1 tablet (500 mg total) by mouth 2 (two) times daily.  . [DISCONTINUED] cyclobenzaprine (FLEXERIL) 5 MG tablet Take 1 tablet (5 mg total) by mouth 3 (three) times daily as needed for muscle spasms. (Patient not  taking: Reported on 03/23/2020)   No facility-administered medications prior to visit.    Review of Systems  Constitutional: Negative.   HENT: Negative.   Eyes: Negative.   Respiratory: Negative.   Cardiovascular: Negative.   Gastrointestinal: Negative.   Endocrine: Negative.   Genitourinary: Negative.   Musculoskeletal: Negative.   Skin: Negative.   Allergic/Immunologic: Negative.   Neurological: Negative.   Hematological: Negative.   Psychiatric/Behavioral: Negative.     Last metabolic panel Lab Results  Component Value Date   GLUCOSE 124 (H) 09/20/2019   NA 140 09/20/2019   K 4.4 09/20/2019   CL 104 09/20/2019   CO2 21 09/20/2019   BUN 18 09/20/2019   CREATININE 0.97 09/20/2019   GFRNONAA 88 09/20/2019   GFRAA 102 09/20/2019   CALCIUM 8.9 09/20/2019   PROT 6.7 09/20/2019   ALBUMIN 4.6 09/20/2019   LABGLOB 2.1 09/20/2019   AGRATIO 2.2 09/20/2019   BILITOT 0.3 09/20/2019   ALKPHOS 64 09/20/2019   AST 15 09/20/2019   ALT 16 09/20/2019   Last lipids Lab Results  Component Value Date   CHOL 135 09/20/2019   HDL 33 (L) 09/20/2019   LDLCALC 64 09/20/2019   TRIG 234 (H) 09/20/2019   CHOLHDL 4.1 09/20/2019   Last hemoglobin A1c Lab Results  Component Value Date   HGBA1C 6.8 (H) 09/20/2019      Objective    BP 125/85 (BP Location: Left Arm, Patient Position: Sitting, Cuff Size: Large)   Pulse 72   Temp (!) 97.3 F (36.3 C) (Temporal)   Resp 16   Ht 5\' 6"  (1.676 m)   Wt 167 lb 12.8 oz (76.1 kg)   BMI 27.08 kg/m  BP Readings from Last 3 Encounters:  03/23/20 125/85  12/04/19 (!) 148/102  09/20/19 138/85   Wt Readings from Last 3 Encounters:  03/23/20 167 lb 12.8 oz (76.1 kg)  12/04/19 160 lb (72.6 kg)  09/20/19 161 lb (73 kg)      Physical Exam Vitals reviewed.  Constitutional:      General: He is not in acute distress.    Appearance: Normal appearance. He is well-developed. He is not diaphoretic.  HENT:     Head: Normocephalic and  atraumatic.     Right Ear: Tympanic membrane, ear canal and external ear normal.     Left Ear: Tympanic membrane, ear canal and external ear normal.  Eyes:     General: No scleral icterus.    Conjunctiva/sclera: Conjunctivae normal.     Pupils: Pupils are equal, round, and reactive to light.  Neck:  Thyroid: No thyromegaly.  Cardiovascular:     Rate and Rhythm: Normal rate and regular rhythm.     Pulses: Normal pulses.     Heart sounds: Normal heart sounds. No murmur.  Pulmonary:     Effort: Pulmonary effort is normal. No respiratory distress.     Breath sounds: Normal breath sounds. No wheezing or rales.  Abdominal:     General: There is no distension.     Palpations: Abdomen is soft.     Tenderness: There is no abdominal tenderness. There is no guarding or rebound.  Musculoskeletal:        General: No deformity.     Cervical back: Neck supple.     Right lower leg: No edema.     Left lower leg: No edema.  Lymphadenopathy:     Cervical: No cervical adenopathy.  Skin:    General: Skin is warm and dry.     Findings: No rash.  Neurological:     Mental Status: He is alert and oriented to person, place, and time. Mental status is at baseline.     Sensory: No sensory deficit.     Motor: No weakness.     Gait: Gait normal.  Psychiatric:        Mood and Affect: Mood normal.        Behavior: Behavior normal.        Thought Content: Thought content normal.    Depression Screen  PHQ 2/9 Scores 03/23/2020 06/20/2019  PHQ - 2 Score 0 0  PHQ- 9 Score 0 1    No results found for any visits on 03/23/20.  Assessment & Plan    Routine Health Maintenance and Physical Exam  Exercise Activities and Dietary recommendations Goals   None     Immunization History  Administered Date(s) Administered  . PFIZER SARS-COV-2 Vaccination 02/07/2020, 03/03/2020    Health Maintenance  Topic Date Due  . PNEUMOCOCCAL POLYSACCHARIDE VACCINE AGE 19-64 HIGH RISK  Never done  . HIV Screening   Never done  . TETANUS/TDAP  Never done  . COLONOSCOPY  Never done  . HEMOGLOBIN A1C  03/19/2020  . INFLUENZA VACCINE  06/14/2020  . FOOT EXAM  09/19/2020  . OPHTHALMOLOGY EXAM  12/01/2020  . COVID-19 Vaccine  Completed    Discussed health benefits of physical activity, and encouraged him to engage in regular exercise appropriate for his age and condition.  Problem List Items Addressed This Visit      Cardiovascular and Mediastinum   Essential hypertension    Well controlled Continue current medications Recheck metabolic panel F/u in 6 months       Relevant Orders   Basic Metabolic Panel (BMET)     Endocrine   Diabetes mellitus (HCC)    Well controlled with last A1c 6.6 Continue current medications UTD on eye exam, foot exam On ACEi On Statin Discussed diet and exercise F/u in 6 months       Relevant Orders   Hemoglobin A1c   Hyperlipidemia associated with type 2 diabetes mellitus (HCC)    Previously well controlled Continue statin Goal LDL < 70 Recheck FLP next visit Follow up in 6 months        Other   Overweight    Discussed importance of healthy weight management Discussed diet and exercise       Annual physical exam - Primary    Stable. Patient advised to continue eating healthy and exercise daily. Patient will try to get colonoscopy report and  immunization report from [place]       Relevant Orders   Hemoglobin C9F   Basic Metabolic Panel (BMET)    Other Visit Diagnoses    Colon cancer screening       Encounter for screening for HIV       Relevant Orders   HIV Antibody (routine testing w rflx)     - will attempt to get records from colonoscopy in [place] 4-5 years ago   Return in about 6 months (around 09/23/2020) for chronic disease f/u.     I, Lavon Paganini, MD, have reviewed all documentation for this visit. The documentation on 03/23/20 for the exam, diagnosis, procedures, and orders are all accurate and  complete.   Quincey Quesinberry, Dionne Bucy, MD, MPH [facility]

## 2020-03-23 ENCOUNTER — Encounter: Payer: Self-pay | Admitting: Family Medicine

## 2020-03-23 ENCOUNTER — Ambulatory Visit (INDEPENDENT_AMBULATORY_CARE_PROVIDER_SITE_OTHER): Payer: BLUE CROSS/BLUE SHIELD | Admitting: Family Medicine

## 2020-03-23 ENCOUNTER — Other Ambulatory Visit: Payer: Self-pay

## 2020-03-23 VITALS — BP 125/85 | HR 72 | Temp 97.3°F | Resp 16 | Ht 66.0 in | Wt 167.8 lb

## 2020-03-23 DIAGNOSIS — Z0000 Encounter for general adult medical examination without abnormal findings: Secondary | ICD-10-CM | POA: Insufficient documentation

## 2020-03-23 DIAGNOSIS — E1169 Type 2 diabetes mellitus with other specified complication: Secondary | ICD-10-CM

## 2020-03-23 DIAGNOSIS — Z114 Encounter for screening for human immunodeficiency virus [HIV]: Secondary | ICD-10-CM

## 2020-03-23 DIAGNOSIS — E785 Hyperlipidemia, unspecified: Secondary | ICD-10-CM

## 2020-03-23 DIAGNOSIS — E663 Overweight: Secondary | ICD-10-CM

## 2020-03-23 DIAGNOSIS — Z23 Encounter for immunization: Secondary | ICD-10-CM

## 2020-03-23 DIAGNOSIS — Z1211 Encounter for screening for malignant neoplasm of colon: Secondary | ICD-10-CM

## 2020-03-23 DIAGNOSIS — I10 Essential (primary) hypertension: Secondary | ICD-10-CM | POA: Diagnosis not present

## 2020-03-23 NOTE — Assessment & Plan Note (Signed)
Well controlled Continue current medications Recheck metabolic panel F/u in 6 months  

## 2020-03-23 NOTE — Assessment & Plan Note (Signed)
Stable. Patient advised to continue eating healthy and exercise daily. Patient will try to get colonoscopy report and immunization report from [place]

## 2020-03-23 NOTE — Assessment & Plan Note (Signed)
Well controlled with last A1c 6.6 Continue current medications UTD on eye exam, foot exam On ACEi On Statin Discussed diet and exercise F/u in 6 months

## 2020-03-23 NOTE — Assessment & Plan Note (Signed)
Discussed importance of healthy weight management Discussed diet and exercise  

## 2020-03-23 NOTE — Assessment & Plan Note (Addendum)
Previously well controlled Continue statin Goal LDL < 70 Recheck FLP next visit Follow up in 6 months

## 2020-03-24 ENCOUNTER — Telehealth: Payer: Self-pay

## 2020-03-24 DIAGNOSIS — E1169 Type 2 diabetes mellitus with other specified complication: Secondary | ICD-10-CM

## 2020-03-24 LAB — BASIC METABOLIC PANEL
BUN/Creatinine Ratio: 19 (ref 9–20)
BUN: 18 mg/dL (ref 6–24)
CO2: 22 mmol/L (ref 20–29)
Calcium: 9.3 mg/dL (ref 8.7–10.2)
Chloride: 105 mmol/L (ref 96–106)
Creatinine, Ser: 0.96 mg/dL (ref 0.76–1.27)
GFR calc Af Amer: 102 mL/min/{1.73_m2} (ref >59)
GFR calc non Af Amer: 89 mL/min/{1.73_m2} (ref >59)
Glucose: 149 mg/dL — ABNORMAL HIGH (ref 65–99)
Potassium: 4.4 mmol/L (ref 3.5–5.2)
Sodium: 142 mmol/L (ref 134–144)

## 2020-03-24 LAB — HIV ANTIBODY (ROUTINE TESTING W REFLEX): HIV Screen 4th Generation wRfx: NONREACTIVE

## 2020-03-24 LAB — HEMOGLOBIN A1C
Est. average glucose Bld gHb Est-mCnc: 180 mg/dL
Hgb A1c MFr Bld: 7.9 % — ABNORMAL HIGH (ref 4.8–5.6)

## 2020-03-24 MED ORDER — METFORMIN HCL 500 MG PO TABS: 1000.0000 mg | ORAL_TABLET | Freq: Two times a day (BID) | ORAL | 1 refills | Status: DC

## 2020-03-24 NOTE — Telephone Encounter (Signed)
Seen by patient Alexander Mercado on 03/24/2020 9:36 AM EDT. Patient reports he is taking metformin. Patient agreed to increase metformin to 1000 mg BID. appt scheduled.

## 2020-03-24 NOTE — Telephone Encounter (Signed)
-----   Message from Erasmo Downer, MD sent at 03/24/2020  8:17 AM EDT ----- Normal labs, except A1c has increased to 7.9.  Goal is <7.  Is he still taking metformin regularly as prescribed?  Increase metformin to 1000 mg twice daily.  Decrease carbohydrate intake and try to get some exercise.  OK to send new Rx.  Recommend f/u appt in 3 months to repeat A1c

## 2020-06-24 ENCOUNTER — Ambulatory Visit: Payer: BLUE CROSS/BLUE SHIELD | Admitting: Family Medicine

## 2020-09-03 IMAGING — CR DG LUMBAR SPINE COMPLETE 4+V
1 series · 5 of 5 positions shown · non-contrast
Comparison: None.

CLINICAL DATA: Chronic low back pain. History of a fall from a
ladder 1 month ago. Initial encounter.

EXAM:
LUMBAR SPINE - COMPLETE 4+ VIEW

[Series 1: dg lumbar spine complete 4 +v · 0.14mm/px · 5 of 5 slices shown]
[im 1/5]
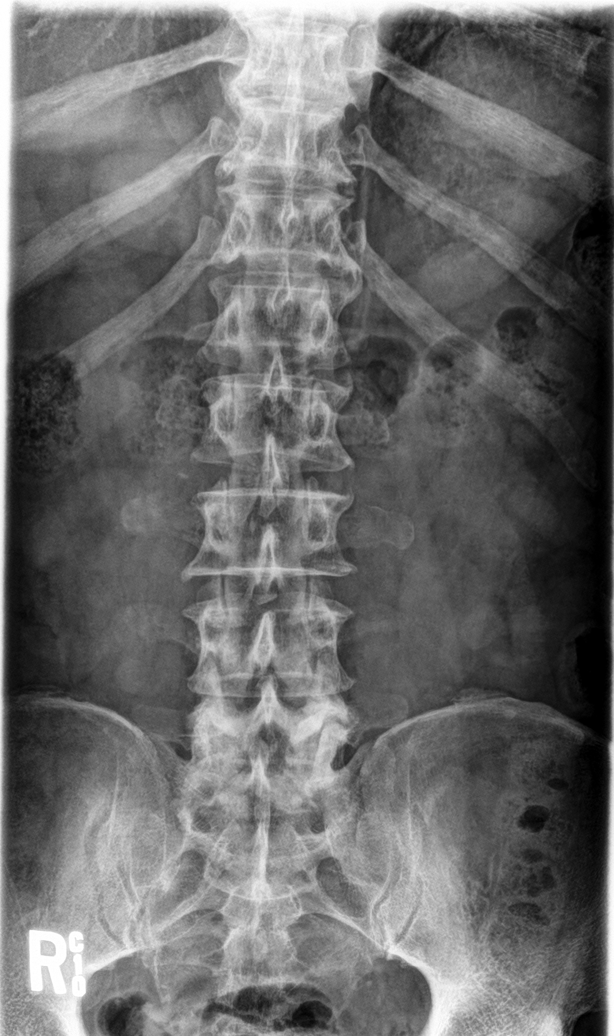
[im 2/5]
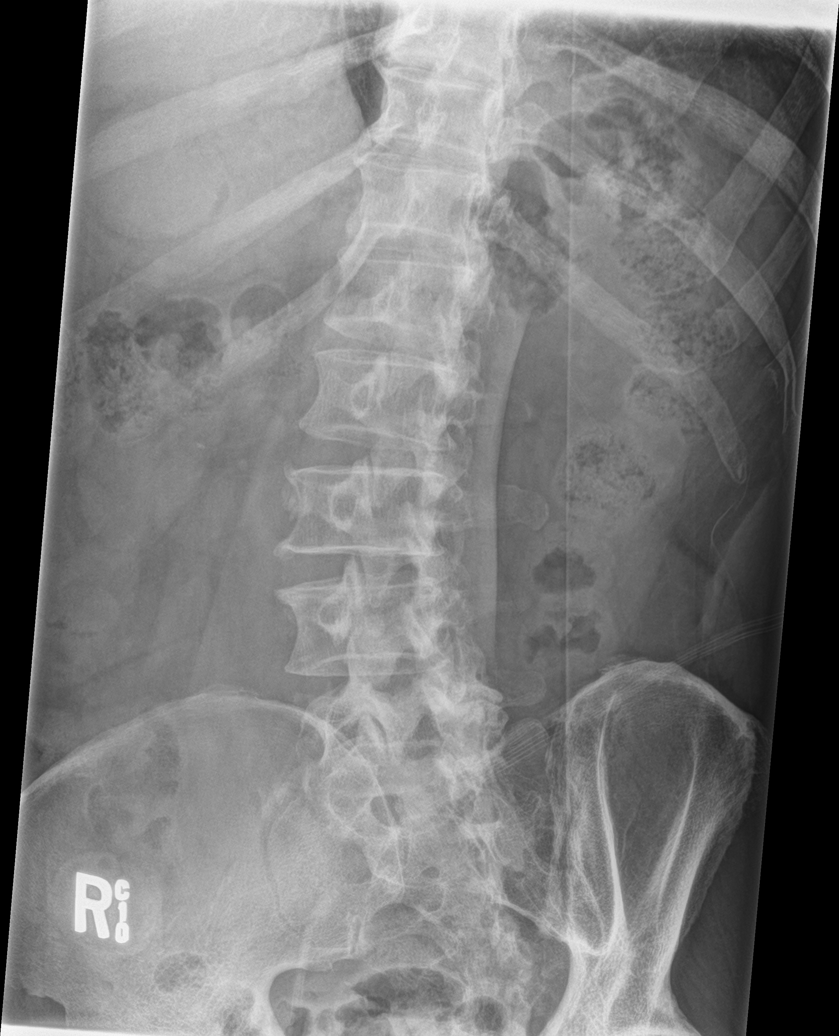
[im 3/5]
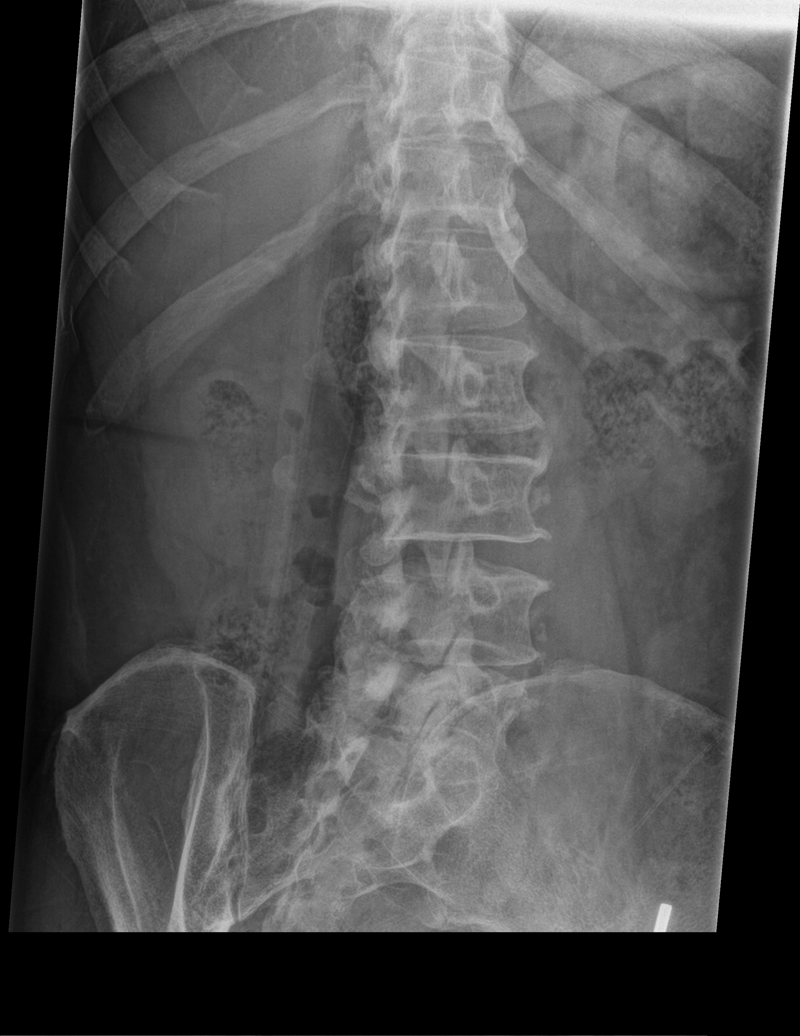
[im 4/5]
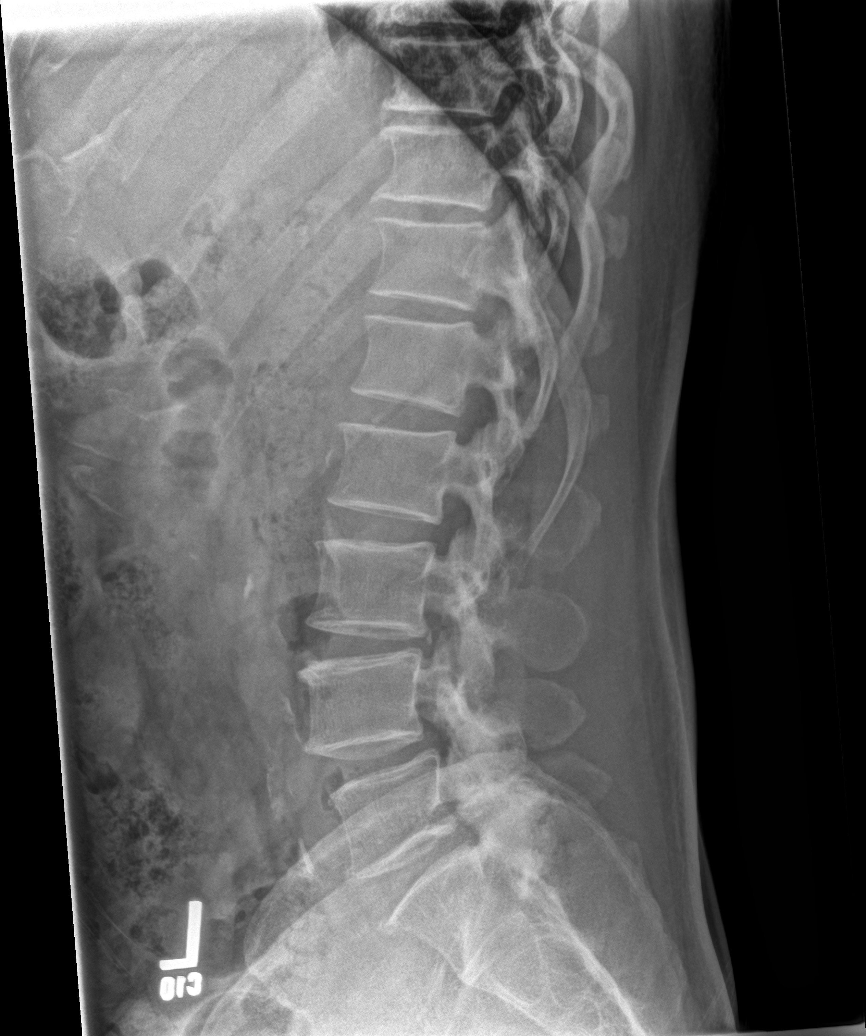
[im 5/5]
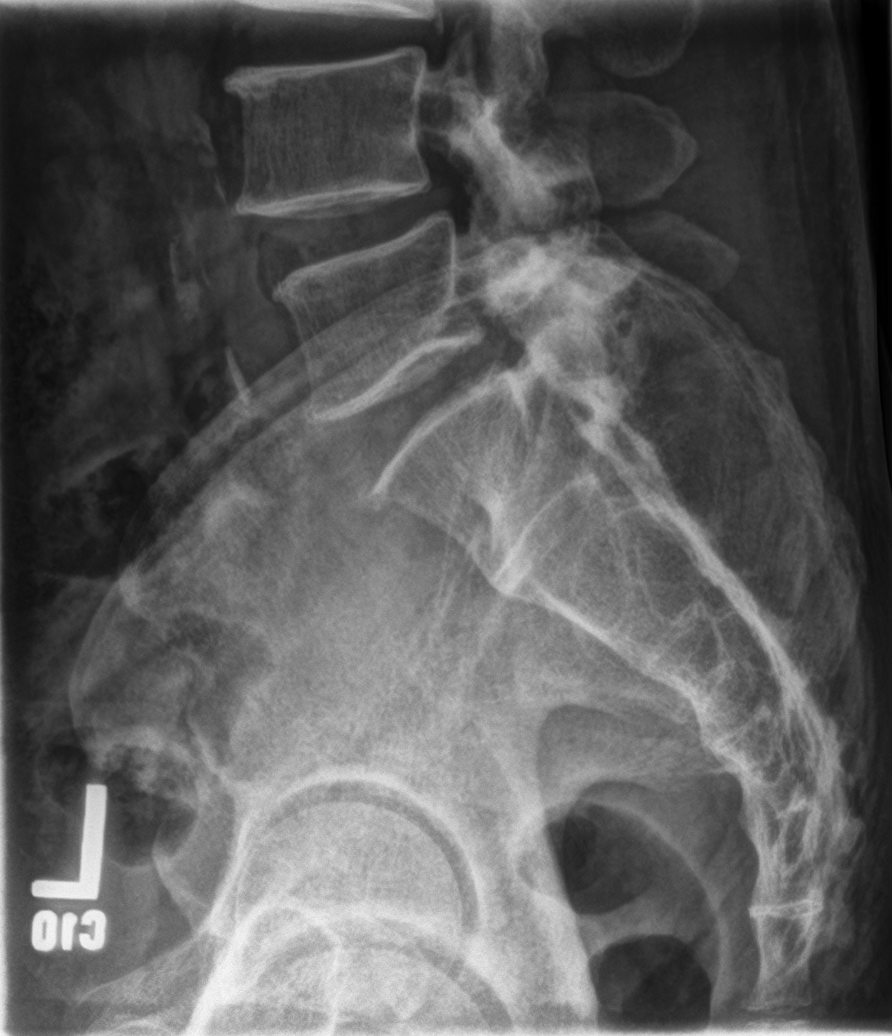

[5 of 5 positions shown; findings below may reference images not displayed]

FINDINGS: There is no fracture. Trace anterolisthesis L4 on L5 is due to facet
arthropathy. Advanced facet degenerative change is seen at L5-S1.
Intervertebral disc space height is maintained. Paraspinous
structures demonstrate atherosclerosis.
IMPRESSION: No acute abnormality.

Lower lumbar facet arthropathy appears worst at L5-S1.

Atherosclerosis.

## 2020-09-23 ENCOUNTER — Ambulatory Visit: Payer: BLUE CROSS/BLUE SHIELD | Admitting: Family Medicine

## 2020-09-24 ENCOUNTER — Ambulatory Visit: Payer: BLUE CROSS/BLUE SHIELD | Admitting: Family Medicine

## 2020-12-02 LAB — HM DIABETES EYE EXAM

## 2021-02-04 ENCOUNTER — Ambulatory Visit (INDEPENDENT_AMBULATORY_CARE_PROVIDER_SITE_OTHER): Payer: 59 | Admitting: Adult Health

## 2021-02-04 ENCOUNTER — Other Ambulatory Visit: Payer: Self-pay

## 2021-02-04 ENCOUNTER — Encounter: Payer: Self-pay | Admitting: Adult Health

## 2021-02-04 VITALS — BP 126/89 | HR 77 | Resp 16 | Wt 161.6 lb

## 2021-02-04 DIAGNOSIS — I10 Essential (primary) hypertension: Secondary | ICD-10-CM | POA: Diagnosis not present

## 2021-02-04 DIAGNOSIS — E1169 Type 2 diabetes mellitus with other specified complication: Secondary | ICD-10-CM | POA: Diagnosis not present

## 2021-02-04 DIAGNOSIS — E785 Hyperlipidemia, unspecified: Secondary | ICD-10-CM | POA: Diagnosis not present

## 2021-02-04 DIAGNOSIS — Z8673 Personal history of transient ischemic attack (TIA), and cerebral infarction without residual deficits: Secondary | ICD-10-CM

## 2021-02-04 LAB — POCT GLYCOSYLATED HEMOGLOBIN (HGB A1C): Hemoglobin A1C: 6.2 % — AB (ref 4.0–5.6)

## 2021-02-04 MED ORDER — ATORVASTATIN CALCIUM 20 MG PO TABS: 20.0000 mg | ORAL_TABLET | Freq: Every day | ORAL | 1 refills | Status: AC

## 2021-02-04 MED ORDER — METFORMIN HCL 500 MG PO TABS: 1000.0000 mg | ORAL_TABLET | Freq: Two times a day (BID) | ORAL | 1 refills | Status: AC

## 2021-02-04 MED ORDER — ASPIRIN 81 MG PO CHEW: 81.0000 mg | CHEWABLE_TABLET | Freq: Every day | ORAL | 1 refills | Status: AC

## 2021-02-04 MED ORDER — LISINOPRIL 20 MG PO TABS: 20.0000 mg | ORAL_TABLET | Freq: Every day | ORAL | 3 refills | Status: AC

## 2021-02-04 NOTE — Patient Instructions (Signed)
Preventing Hypertension Hypertension, also called high blood pressure, is when the force of blood pumping through the arteries is too strong. Arteries are blood vessels that carry blood from the heart throughout the body. Often, hypertension does not cause symptoms until blood pressure is very high. It is important to have your blood pressure checked regularly. Diet and lifestyle changes can help you prevent hypertension, and they may make you feel better overall and improve your quality of life. If you already have hypertension, you may control it with diet and lifestyle changes, as well as with medicine. How can this condition affect me? Over time, hypertension can damage the arteries and decrease blood flow to important parts of the body, including the brain, heart, and kidneys. By keeping your blood pressure in a healthy range, you can help prevent complications like heart attack, heart failure, stroke, kidney failure, and vascular dementia. What can increase my risk?  Being an older adult. Older people are more often affected.  Having family members who have had high blood pressure.  Being obese.  Being male. Males are more likely to have high blood pressure.  Drinking too much alcohol or caffeine.  Smoking or using illegal drugs.  Taking certain medicines, such as antidepressants, decongestants, birth control pills, and NSAIDs, such as ibuprofen.  Having thyroid problems.  Having certain tumors. What actions can I take to prevent or manage this condition? Work with your health care provider to make a hypertension prevention plan that works for you. Follow your plan and keep all follow-up visits as told by your health care provider. Diet changes Maintain a healthy diet. This includes:  Eating less salt (sodium). Ask your health care provider how much sodium is safe for you to have. The general recommendation is to have less than 1 tsp (2,300 mg) of sodium a day. ? Do not add salt  to your food. ? Choose low-sodium options when grocery shopping and eating out.  Limiting fats in your diet. You can do this by eating low-fat or fat-free dairy products and by eating less red meat.  Eating more fruits, vegetables, and whole grains. Make a goal to eat: ? 1-2 cups of fresh fruits and vegetables each day. ? 3-4 servings of whole grains each day.  Avoiding foods and beverages that have added sugars.  Eating fish that contain healthy fats (omega-3 fatty acids), such as mackerel or salmon. If you need help putting together a healthy eating plan, try the DASH diet. This diet is high in fruits, vegetables, and whole grains. It is low in sodium, red meat, and added sugars. DASH stands for Dietary Approaches to Stop Hypertension.   Lifestyle changes Lose weight if you are overweight. Losing just 3?5% of your body weight can help prevent or control hypertension. For example, if your present weight is 200 lb (91 kg), a loss of 3-5% of your weight means losing 6-10 lb (2.7-4.5 kg). Ask your health care provider to help you with a diet and exercise plan to safely lose weight. Other recommendations usually include:  Get enough exercise. Do at least 150 minutes of moderate-intensity exercise each week. You could do this in short exercise sessions several times a day, or you could do longer exercise sessions a few times a week. For example, you could take a brisk 10-minute walk or bike ride, 3 times a day, for 5 days a week.  Find ways to reduce stress, such as exercising, meditating, listening to music, or taking a yoga class.  If you need help reducing stress, ask your health care provider.  Do not use any products that contain nicotine or tobacco, such as cigarettes, e-cigarettes, and chewing tobacco. If you need help quitting, ask your health care provider. Chemicals in tobacco and nicotine products raise your blood pressure each time you use them. If you need help quitting, ask your health  care provider.  Learn how to check your blood pressure at home. Make sure that you know your personal target blood pressure, as told by your health care provider.  Try to sleep 7-9 hours per night.   Alcohol use  Do not drink alcohol if: ? Your health care provider tells you not to drink. ? You are pregnant, may be pregnant, or are planning to become pregnant.  If you drink alcohol: ? Limit how much you use to:  0-1 drink a day for women.  0-2 drinks a day for men. ? Be aware of how much alcohol is in your drink. In the [place] one drink equals one 12 oz bottle of beer (355 mL), one 5 oz glass of wine (148 mL), or one 1 oz glass of hard liquor (44 mL). Medicines In addition to diet and lifestyle changes, your health care provider may recommend medicines to help lower your blood pressure. In general:  You may need to try a few different medicines to find what works best for you.  You may need to take more than one medicine.  Take over-the-counter and prescription medicines only as told by your health care provider. Questions to ask your health care provider  What is my blood pressure goal?  How can I lower my risk for high blood pressure?  How should I monitor my blood pressure at home? Where to find support Your health care provider can help you prevent hypertension and help you keep your blood pressure at a healthy level. Your local hospital or your community may also provide support services and prevention programs. The American Heart Association offers an online support network at supportnetwork.heart.org Where to find more information Learn more about hypertension from:  National Heart, Lung, and Blood Institute: www.nhlbi.nih.gov  Centers for Disease Control and Prevention: www.cdc.gov  American Academy of Family Physicians: familydoctor.org Learn more about the DASH diet from:  National Heart, Lung, and Blood Institute: www.nhlbi.nih.gov Contact a health care  provider if:  You think you are having a reaction to medicines you have taken.  You have recurrent headaches or feel dizzy.  You have swelling in your ankles.  You have trouble with your vision. Get help right away if:  You have sudden, severe chest, back, or abdominal pain or discomfort.  You have shortness of breath.  You have a sudden, severe headache. These symptoms may represent a serious problem that is an emergency. Do not wait to see if the symptoms will go away. Get medical help right away. Call your local emergency services (911 in the [place] Do not drive yourself to the hospital.  Summary  Hypertension often does not cause any symptoms until blood pressure is very high. It is important to get your blood pressure checked regularly.  Diet and lifestyle changes are important steps in preventing hypertension.  By keeping your blood pressure in a healthy range, you may prevent complications like heart attack, heart failure, stroke, and kidney failure.  Work with your health care provider to make a hypertension prevention plan that works for you. This information is not intended to replace advice given to   you by your health care provider. Make sure you discuss any questions you have with your health care provider. Document Revised: 10/01/2019 Document Reviewed: 10/01/2019 Elsevier Patient Education  2021 [employer]. High Cholesterol  High cholesterol is a condition in which the blood has high levels of a white, waxy substance similar to fat (cholesterol). The liver makes all the cholesterol that the body needs. The human body needs small amounts of cholesterol to help build cells. A person gets extra or excess cholesterol from the food that he or she eats. The blood carries cholesterol from the liver to the rest of the body. If you have high cholesterol, deposits (plaques) may build up on the walls of your arteries. Arteries are the blood vessels that carry blood away from  your heart. These plaques make the arteries narrow and stiff. Cholesterol plaques increase your risk for heart attack and stroke. Work with your health care provider to keep your cholesterol levels in a healthy range. What increases the risk? The following factors may make you more likely to develop this condition:  Eating foods that are high in animal fat (saturated fat) or cholesterol.  Being overweight.  Not getting enough exercise.  A family history of high cholesterol (familial hypercholesterolemia).  Use of tobacco products.  Having diabetes. What are the signs or symptoms? There are no symptoms of this condition. How is this diagnosed? This condition may be diagnosed based on the results of a blood test.  If you are older than 56 years of age, your health care provider may check your cholesterol levels every 4-6 years.  You may be checked more often if you have high cholesterol or other risk factors for heart disease. The blood test for cholesterol measures:  "Bad" cholesterol, or LDL cholesterol. This is the main type of cholesterol that causes heart disease. The desired level is less than 100 mg/dL.  "Good" cholesterol, or HDL cholesterol. HDL helps protect against heart disease by cleaning the arteries and carrying the LDL to the liver for processing. The desired level for HDL is 60 mg/dL or higher.  Triglycerides. These are fats that your body can store or burn for energy. The desired level is less than 150 mg/dL.  Total cholesterol. This measures the total amount of cholesterol in your blood and includes LDL, HDL, and triglycerides. The desired level is less than 200 mg/dL. How is this treated? This condition may be treated with:  Diet changes. You may be asked to eat foods that have more fiber and less saturated fats or added sugar.  Lifestyle changes. These may include regular exercise, maintaining a healthy weight, and quitting use of tobacco  products.  Medicines. These are given when diet and lifestyle changes have not worked. You may be prescribed a statin medicine to help lower your cholesterol levels. Follow these instructions at home: Eating and drinking  Eat a healthy, balanced diet. This diet includes: ? Daily servings of a variety of fresh, frozen, or canned fruits and vegetables. ? Daily servings of whole grain foods that are rich in fiber. ? Foods that are low in saturated fats and trans fats. These include poultry and fish without skin, lean cuts of meat, and low-fat dairy products. ? A variety of fish, especially oily fish that contain omega-3 fatty acids. Aim to eat fish at least 2 times a week.  Avoid foods and drinks that have added sugar.  Use healthy cooking methods, such as roasting, grilling, broiling, baking, poaching, steaming, and stir-frying.  Do not fry your food except for stir-frying.   Lifestyle  Get regular exercise. Aim to exercise for a total of 150 minutes a week. Increase your activity level by doing activities such as gardening, walking, and taking the stairs.  Do not use any products that contain nicotine or tobacco, such as cigarettes, e-cigarettes, and chewing tobacco. If you need help quitting, ask your health care provider.   General instructions  Take over-the-counter and prescription medicines only as told by your health care provider.  Keep all follow-up visits as told by your health care provider. This is important. Where to find more information  American Heart Association: www.heart.org  National Heart, Lung, and Blood Institute: [employer]Ste[employer].is Contact a health care provider if:  You have trouble achieving or maintaining a healthy diet or weight.  You are starting an exercise program.  You are unable to stop smoking. Get help right away if:  You have chest pain.  You have trouble breathing.  You have any symptoms of a stroke. "BE FAST" is an easy way to remember the  main warning signs of a stroke: ? B - Balance. Signs are dizziness, sudden trouble walking, or loss of balance. ? E - Eyes. Signs are trouble seeing or a sudden change in vision. ? F - Face. Signs are sudden weakness or numbness of the face, or the face or eyelid drooping on one side. ? A - Arms. Signs are weakness or numbness in an arm. This happens suddenly and usually on one side of the body. ? S - Speech. Signs are sudden trouble speaking, slurred speech, or trouble understanding what people say. ? T - Time. Time to call emergency services. Write down what time symptoms started.  You have other signs of a stroke, such as: ? A sudden, severe headache with no known cause. ? Nausea or vomiting. ? Seizure. These symptoms may represent a serious problem that is an emergency. Do not wait to see if the symptoms will go away. Get medical help right away. Call your local emergency services (911 in the [place] Do not drive yourself to the hospital. Summary  Cholesterol plaques increase your risk for heart attack and stroke. Work with your health care provider to keep your cholesterol levels in a healthy range.  Eat a healthy, balanced diet, get regular exercise, and maintain a healthy weight.  Do not use any products that contain nicotine or tobacco, such as cigarettes, e-cigarettes, and chewing tobacco.  Get help right away if you have any symptoms of a stroke. This information is not intended to replace advice given to you by your health care provider. Make sure you discuss any questions you have with your health care provider. Document Revised: 09/30/2019 Document Reviewed: 09/30/2019 Elsevier Patient Education  2021 [employer] Inc. Cholesterol Content in Foods Cholesterol is a waxy, fat-like substance that helps to carry fat in the blood. The body needs cholesterol in small amounts, but too much cholesterol can cause damage to the arteries and heart. Most people should eat less than 200  milligrams (mg) of cholesterol a day. Foods with cholesterol Cholesterol is found in animal-based foods, such as meat, seafood, and dairy. Generally, low-fat dairy and lean meats have less cholesterol than full-fat dairy and fatty meats. The milligrams of cholesterol per serving (mg per serving) of common cholesterol-containing foods are listed below. Meat and other proteins  Egg -- one large whole egg has 186 mg.  Veal shank -- 4 oz has 141 mg.  [employer]  ground [place] (93% lean) -- 4 oz has 118 mg.  Fat-trimmed lamb loin -- 4 oz has 106 mg.  Lean ground beef (90% lean) -- 4 oz has 100 mg.  Lobster -- 3.5 oz has 90 mg.  Pork loin chops -- 4 oz has 86 mg.  Canned salmon -- 3.5 oz has 83 mg.  Fat-trimmed beef top loin -- 4 oz has 78 mg.  Frankfurter -- 1 frank (3.5 oz) has 77 mg.  Crab -- 3.5 oz has 71 mg.  Roasted chicken without skin, white meat -- 4 oz has 66 mg.  Light bologna -- 2 oz has 45 mg.  Deli-cut [place] -- 2 oz has 31 mg.  Canned tuna -- 3.5 oz has 31 mg.  Tomasa Blase -- 1 oz has 29 mg.  Oysters and mussels (raw) -- 3.5 oz has 25 mg.  Mackerel -- 1 oz has 22 mg.  Trout -- 1 oz has 20 mg.  Pork sausage -- 1 link (1 oz) has 17 mg.  Salmon -- 1 oz has 16 mg.  Tilapia -- 1 oz has 14 mg. Dairy  Soft-serve ice cream --  cup (4 oz) has 103 mg.  Whole-milk yogurt -- 1 cup (8 oz) has 29 mg.  Cheddar cheese -- 1 oz has 28 mg.  American cheese -- 1 oz has 28 mg.  Whole milk -- 1 cup (8 oz) has 23 mg.  2% milk -- 1 cup (8 oz) has 18 mg.  Cream cheese -- 1 tablespoon (Tbsp) has 15 mg.  Cottage cheese --  cup (4 oz) has 14 mg.  Low-fat (1%) milk -- 1 cup (8 oz) has 10 mg.  Sour cream -- 1 Tbsp has 8.5 mg.  Low-fat yogurt -- 1 cup (8 oz) has 8 mg.  Nonfat Greek yogurt -- 1 cup (8 oz) has 7 mg.  Half-and-half cream -- 1 Tbsp has 5 mg. Fats and oils  Cod liver oil -- 1 tablespoon (Tbsp) has 82 mg.  Butter -- 1 Tbsp has 15 mg.  Lard -- 1 Tbsp has 14  mg.  Bacon grease -- 1 Tbsp has 14 mg.  Mayonnaise -- 1 Tbsp has 5-10 mg.  Margarine -- 1 Tbsp has 3-10 mg. Exact amounts of cholesterol in these foods may vary depending on specific ingredients and brands.   Foods without cholesterol Most plant-based foods do not have cholesterol unless you combine them with a food that has cholesterol. Foods without cholesterol include:  Grains and cereals.  Vegetables.  Fruits.  Vegetable oils, such as olive, canola, and sunflower oil.  Legumes, such as peas, beans, and lentils.  Nuts and seeds.  Egg whites.   Summary  The body needs cholesterol in small amounts, but too much cholesterol can cause damage to the arteries and heart.  Most people should eat less than 200 milligrams (mg) of cholesterol a day. This information is not intended to replace advice given to you by your health care provider. Make sure you discuss any questions you have with your health care provider. Document Revised: 03/23/2020 Document Reviewed: 03/23/2020 Elsevier Patient Education  2021 Else[employer] Inc. Hypertension, Adult Hypertension is another name for high blood pressure. High blood pressure forces your heart to work harder to pump blood. This can cause problems over time. There are two numbers in a blood pressure reading. There is a top number (systolic) over a bottom number (diastolic). It is best to have a blood pressure that is below 120/80. Healthy choices can  help lower your blood pressure, or you may need medicine to help lower it. What are the causes? The cause of this condition is not known. Some conditions may be related to high blood pressure. What increases the risk?  Smoking.  Having type 2 diabetes mellitus, high cholesterol, or both.  Not getting enough exercise or physical activity.  Being overweight.  Having too much fat, sugar, calories, or salt (sodium) in your diet.  Drinking too much alcohol.  Having long-term (chronic) kidney  disease.  Having a family history of high blood pressure.  Age. Risk increases with age.  Race. You may be at higher risk if you are African American.  Gender. Men are at higher risk than women before age 102. After age 30, women are at higher risk than men.  Having obstructive sleep apnea.  Stress. What are the signs or symptoms?  High blood pressure may not cause symptoms. Very high blood pressure (hypertensive crisis) may cause: ? Headache. ? Feelings of worry or nervousness (anxiety). ? Shortness of breath. ? Nosebleed. ? A feeling of being sick to your stomach (nausea). ? Throwing up (vomiting). ? Changes in how you see. ? Very bad chest pain. ? Seizures. How is this treated?  This condition is treated by making healthy lifestyle changes, such as: ? Eating healthy foods. ? Exercising more. ? Drinking less alcohol.  Your health care provider may prescribe medicine if lifestyle changes are not enough to get your blood pressure under control, and if: ? Your top number is above 130. ? Your bottom number is above 80.  Your personal target blood pressure may vary. Follow these instructions at home: Eating and drinking  If told, follow the DASH eating plan. To follow this plan: ? Fill one half of your plate at each meal with fruits and vegetables. ? Fill one fourth of your plate at each meal with whole grains. Whole grains include whole-wheat pasta, brown rice, and whole-grain bread. ? Eat or drink low-fat dairy products, such as skim milk or low-fat yogurt. ? Fill one fourth of your plate at each meal with low-fat (lean) proteins. Low-fat proteins include fish, chicken without skin, eggs, beans, and tofu. ? Avoid fatty meat, cured and processed meat, or chicken with skin. ? Avoid pre-made or processed food.  Eat less than 1,500 mg of salt each day.  Do not drink alcohol if: ? Your doctor tells you not to drink. ? You are pregnant, may be pregnant, or are planning to  become pregnant.  If you drink alcohol: ? Limit how much you use to:  0-1 drink a day for women.  0-2 drinks a day for men. ? Be aware of how much alcohol is in your drink. In the [place] one drink equals one 12 oz bottle of beer (355 mL), one 5 oz glass of wine (148 mL), or one 1 oz glass of hard liquor (44 mL).   Lifestyle  Work with your doctor to stay at a healthy weight or to lose weight. Ask your doctor what the best weight is for you.  Get at least 30 minutes of exercise most days of the week. This may include walking, swimming, or biking.  Get at least 30 minutes of exercise that strengthens your muscles (resistance exercise) at least 3 days a week. This may include lifting weights or doing Pilates.  Do not use any products that contain nicotine or tobacco, such as cigarettes, e-cigarettes, and chewing tobacco. If you need help quitting,  ask your doctor.  Check your blood pressure at home as told by your doctor.  Keep all follow-up visits as told by your doctor. This is important.   Medicines  Take over-the-counter and prescription medicines only as told by your doctor. Follow directions carefully.  Do not skip doses of blood pressure medicine. The medicine does not work as well if you skip doses. Skipping doses also puts you at risk for problems.  Ask your doctor about side effects or reactions to medicines that you should watch for. Contact a doctor if you:  Think you are having a reaction to the medicine you are taking.  Have headaches that keep coming back (recurring).  Feel dizzy.  Have swelling in your ankles.  Have trouble with your vision. Get help right away if you:  Get a very bad headache.  Start to feel mixed up (confused).  Feel weak or numb.  Feel faint.  Have very bad pain in your: ? Chest. ? Belly (abdomen).  Throw up more than once.  Have trouble breathing. Summary  Hypertension is another name for high blood pressure.  High blood  pressure forces your heart to work harder to pump blood.  For most people, a normal blood pressure is less than 120/80.  Making healthy choices can help lower blood pressure. If your blood pressure does not get lower with healthy choices, you may need to take medicine. This information is not intended to replace advice given to you by your health care provider. Make sure you discuss any questions you have with your health care provider. Document Revised: 07/11/2018 Document Reviewed: 07/11/2018 Elsevier Patient Education  2021 [employer].

## 2021-02-04 NOTE — Progress Notes (Signed)
Established patient visit   Patient: Alexander Mercado   DOB: 1966/05/21   55 y.o. Male  MRN: [id number] Visit Date: 02/04/2021  Today's healthcare provider: Jairo Ben, FNP   Chief Complaint  Patient presents with  . Diabetes  . Hypertension  . Hyperlipidemia   Subjective    HPI  Diabetes Mellitus Type II, follow-up A1C hemoglobin 6.2 Lab Results  Component Value Date   HGBA1C 6.2 (A) 02/04/2021   HGBA1C 7.9 (H) 03/23/2020   HGBA1C 6.8 (H) 09/20/2019   Last seen for diabetes 10 months ago.  Management since then includes continuing the same treatment. He reports excellent compliance with treatment. He is not having side effects.   Home blood sugar records: fasting range: 100-120  Episodes of hypoglycemia? No    Current insulin regiment: none Most Recent Eye Exam: 12/02/20  --------------------------------------------------------------------------------------------------- Hypertension, follow-up  BP Readings from Last 3 Encounters:  02/04/21 126/89  03/23/20 125/85  12/04/19 (!) 148/102   Wt Readings from Last 3 Encounters:  02/04/21 161 lb 9.6 oz (73.3 kg)  03/23/20 167 lb 12.8 oz (76.1 kg)  12/04/19 160 lb (72.6 kg)     He was last seen for hypertension 10 months ago.  BP at that visit was 125/85. Management since that visit includes well controlled. He reports excellent compliance with treatment. He is not having side effects.  He is exercising. He is not adherent to low salt diet.   Outside blood pressures are systolic 120s diastolic 80-98  He does not smoke.  Use of agents associated with hypertension: none.   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  He was from there started on Plavix and referred to [facility] neurology clinic.  [facility] neurology stopped his Plavix and started him on 81 mg aspirin at office visit 10/28/2020 He reports he has follow-up with [facility] neurology  and denies any new symptoms or red flag symptoms.   --------------------------------------------------------------------------------------------------- Lipid/Cholesterol, follow-up  Last Lipid Panel: Lab Results  Component Value Date   CHOL 135 09/20/2019   LDLCALC 64 09/20/2019   HDL 33 (L) 09/20/2019   TRIG 234 (H) 09/20/2019    He was last seen for this 10 months ago.  Management since that visit includes none.  He reports excellent compliance with treatment. He is not having side effects.   Symptoms: No appetite changes No foot ulcerations  No chest pain No chest pressure/discomfort  No dyspnea No orthopnea  No fatigue No lower extremity edema  No palpitations No paroxysmal nocturnal dyspnea  No nausea No numbness or tingling of extremity  No polydipsia No polyuria  No speech difficulty No syncope   He is following a Regular diet. Current exercise: no regular exercise  Last metabolic panel Lab Results  Component Value Date   GLUCOSE 149 (H) 03/23/2020   NA 142 03/23/2020   K 4.4 03/23/2020   BUN 18 03/23/2020   CREATININE 0.96 03/23/2020   GFRNONAA 89 03/23/2020   GFRAA 102 03/23/2020   CALCIUM 9.3 03/23/2020   AST 15 09/20/2019   ALT 16 09/20/2019   The ASCVD Risk score (Goff DC Jr., et al., 2013) failed to calculate for the following reasons:   The patient has a prior MI or stroke diagnosis  --------------------------------------------------------------------------------------------------- Patient Active Problem List   Diagnosis Date Noted  . Annual physical exam 03/23/2020  . Hyperlipidemia associated with type 2 diabetes mellitus (HCC) 09/20/2019  .  Diabetes mellitus (HCC) 06/20/2019  . Essential hypertension 06/20/2019  . Overweight 06/20/2019   Past Medical History:  Diagnosis Date  . Diabetes mellitus without complication (HCC)   . Hypertension    No Known Allergies    Patient  denies any fever, body aches,chills, rash, chest pain, shortness  of breath, nausea, vomiting, or diarrhea.  Denies dizziness, lightheadedness, pre syncopal or syncopal episodes.   Medications: Outpatient Medications Prior to Visit  Medication Sig  . [DISCONTINUED] clopidogrel (PLAVIX) 75 MG tablet clopidogrel 75 mg tablet  TAKE 1 TABLET BY MOUTH ONCE DAILY FOR 21 DAYS  . [DISCONTINUED] atorvastatin (LIPITOR) 20 MG tablet Take 1 tablet (20 mg total) by mouth daily.  . [DISCONTINUED] lisinopril (ZESTRIL) 20 MG tablet Take 1 tablet (20 mg total) by mouth daily.  . [DISCONTINUED] meloxicam (MOBIC) 15 MG tablet Take 1 tablet (15 mg total) by mouth daily.  . [DISCONTINUED] metFORMIN (GLUCOPHAGE) 500 MG tablet Take 2 tablets (1,000 mg total) by mouth 2 (two) times daily with a meal.   No facility-administered medications prior to visit.    Review of Systems  Constitutional: Negative.   HENT: Negative.   Eyes: Negative.   Respiratory: Negative.   Cardiovascular: Negative.   Gastrointestinal: Negative.   Genitourinary: Negative.   Musculoskeletal: Negative.   Neurological: Negative.   Hematological: Negative.   Psychiatric/Behavioral: Negative.     Last CBC No results found for: WBC, HGB, HCT, MCV, MCH, RDW, PLT Last metabolic panel Lab Results  Component Value Date   GLUCOSE 149 (H) 03/23/2020   NA 142 03/23/2020   K 4.4 03/23/2020   CL 105 03/23/2020   CO2 22 03/23/2020   BUN 18 03/23/2020   CREATININE 0.96 03/23/2020   GFRNONAA 89 03/23/2020   GFRAA 102 03/23/2020   CALCIUM 9.3 03/23/2020   PROT 6.7 09/20/2019   ALBUMIN 4.6 09/20/2019   LABGLOB 2.1 09/20/2019   AGRATIO 2.2 09/20/2019   BILITOT 0.3 09/20/2019   ALKPHOS 64 09/20/2019   AST 15 09/20/2019   ALT 16 09/20/2019   Last lipids Lab Results  Component Value Date   CHOL 135 09/20/2019   HDL 33 (L) 09/20/2019   LDLCALC 64 09/20/2019   TRIG 234 (H) 09/20/2019   CHOLHDL 4.1 09/20/2019   Last hemoglobin A1c Lab Results  Component Value Date   HGBA1C 6.2 (A) 02/04/2021    Last thyroid functions No results found for: TSH, T3TOTAL, T4TOTAL, THYROIDAB Last vitamin D No results found for: 25OHVITD2, 25OHVITD3, VD25OH Last vitamin B12 and Folate No results found for: VITAMINB12, FOLATE     Objective    BP 126/89   Pulse 77   Resp 16   Wt 161 lb 9.6 oz (73.3 kg)   SpO2 98%   BMI 26.08 kg/m  BP Readings from Last 3 Encounters:  02/04/21 126/89  03/23/20 125/85  12/04/19 (!) 148/102   Wt Readings from Last 3 Encounters:  02/04/21 161 lb 9.6 oz (73.3 kg)  03/23/20 167 lb 12.8 oz (76.1 kg)  12/04/19 160 lb (72.6 kg)       Physical Exam Vitals and nursing note reviewed.  Constitutional:      Appearance: Normal appearance. He is not ill-appearing or toxic-appearing.     Comments: Patient is alert and oriented and responsive to questions Engages in eye contact with provider. Speaks in full sentences without any pauses without any shortness of breath or distress.    HENT:     Head: Normocephalic and atraumatic.  Right Ear: External ear normal.     Left Ear: External ear normal.     Nose: Nose normal.     Mouth/Throat:     Mouth: Mucous membranes are moist.     Pharynx: No oropharyngeal exudate or posterior oropharyngeal erythema.  Eyes:     General: No scleral icterus.       Right eye: No discharge.        Left eye: No discharge.     Extraocular Movements: Extraocular movements intact.     Conjunctiva/sclera: Conjunctivae normal.  Cardiovascular:     Rate and Rhythm: Normal rate and regular rhythm.     Pulses: Normal pulses.     Heart sounds: Normal heart sounds. No murmur heard. No friction rub. No gallop.   Pulmonary:     Effort: Pulmonary effort is normal. No respiratory distress.     Breath sounds: Normal breath sounds. No stridor. No wheezing, rhonchi or rales.  Chest:     Chest wall: No tenderness.  Abdominal:     General: There is no distension.     Palpations: Abdomen is soft.     Tenderness: There is no abdominal  tenderness.  Musculoskeletal:        General: Normal range of motion.     Cervical back: Normal range of motion and neck supple.     Right lower leg: No edema.     Left lower leg: No edema.  Skin:    General: Skin is warm.     Findings: No lesion.  Neurological:     General: No focal deficit present.     Mental Status: He is alert and oriented to person, place, and time.     Motor: No weakness.     Gait: Gait (.attes) normal.  Psychiatric:        Mood and Affect: Mood normal.        Behavior: Behavior normal.        Thought Content: Thought content normal.        Judgment: Judgment normal.      Results for orders placed or performed in visit on 02/04/21  POCT glycosylated hemoglobin (Hb A1C)  Result Value Ref Range   Hemoglobin A1C 6.2 (A) 4.0 - 5.6 %   HbA1c POC (<> result, manual entry)     HbA1c, POC (prediabetic range)     HbA1c, POC (controlled diabetic range)      Assessment & Plan     1. Type 2 diabetes mellitus with other specified complication, without long-term current use of insulin (HCC)  Hemoglobin A1C controlled. Continue current dosage of metformin, monitor for hypoglycemia/ hyperglycemia  as discussed. - POCT glycosylated hemoglobin (Hb A1C) - CBC with Differential/Platelet - Comprehensive metabolic panel - metFORMIN (GLUCOPHAGE) 500 MG tablet; Take 2 tablets (1,000 mg total) by mouth 2 (two) times daily with a meal.  Dispense: 180 tablet; Refill: 1  2. History of transient ischemic attack involving posterior circulation Plavix  was stopped at [facility] neurology who follows the above diagnosis. Denies any changes.  - aspirin 81 MG chewable tablet; Chew 1 tablet (81 mg total) by mouth daily.  Dispense: 90 tablet; Refill: 1  3. Hypertension, unspecified type - TSH - lisinopril (ZESTRIL) 20 MG tablet; Take 1 tablet (20 mg total) by mouth daily.  Dispense: 90 tablet; Refill: 3  4. Hyperlipidemia, unspecified hyperlipidemia type Lifestyle and dietary changes.   - Lipid Panel w/o Chol/HDL Ratio - atorvastatin (LIPITOR) 20 MG tablet; Take 1 tablet (  20 mg total) by mouth daily.  Dispense: 90 tablet; Refill: 1  Fasting labs today.  Orders Placed This Encounter  Procedures  . CBC with Differential/Platelet  . Comprehensive metabolic panel  . TSH  . Lipid Panel w/o Chol/HDL Ratio  . POCT glycosylated hemoglobin (Hb A1C)  needs urine micro albumin at next visit has CPE scheduled. Return in about 3 months (around 05/07/2021), or if symptoms worsen or fail to improve, for at any time for any worsening symptoms, Go to Emergency room/ urgent care if worse.     Red Flags discussed. The patient was given clear instructions to go to ER or return to medical center if any red flags develop, symptoms do not improve, worsen or new problems develop. They verbalized understanding.  The entirety of the information documented in the History of Present Illness, Review of Systems and Physical Exam were personally obtained by me. Portions of this information were initially documented by the CMA and reviewed by me for thoroughness and accuracy.     Jairo BenMichelle Smith Layla Kesling, FNP  [facility] [phone] (phone) [phone] (fax)  [facility]

## 2021-02-09 ENCOUNTER — Ambulatory Visit: Payer: BLUE CROSS/BLUE SHIELD | Admitting: Family Medicine

## 2021-02-11 ENCOUNTER — Ambulatory Visit: Payer: BLUE CROSS/BLUE SHIELD | Admitting: Family Medicine

## 2021-05-02 ENCOUNTER — Ambulatory Visit
Admission: EM | Admit: 2021-05-02 | Discharge: 2021-05-02 | Disposition: A | Discharge status: Home / Self Care | Payer: 59 | Attending: Student | Admitting: Student

## 2021-05-02 ENCOUNTER — Other Ambulatory Visit: Payer: Self-pay

## 2021-05-02 DIAGNOSIS — L259 Unspecified contact dermatitis, unspecified cause: Secondary | ICD-10-CM

## 2021-05-02 MED ORDER — METHYLPREDNISOLONE 4 MG PO TBPK: ORAL_TABLET | ORAL | 0 refills | Status: AC

## 2021-05-02 NOTE — Discharge Instructions (Addendum)
-  Take medication as prescribed. -After you shower make sure that your skin is dry. -Follow-up if areas of irritation fail to improve.

## 2021-05-02 NOTE — ED Provider Notes (Signed)
[facility]    CSN: [id number] Arrival date & time: 05/02/21  1512      History   Chief Complaint Chief Complaint  Patient presents with   Insect Bite    HPI Alexander Mercado is a 55 y.o. male who presents today for several areas of irritation which he believes to be bug bites.  The patient noticed these areas over the past 3-4 days.  He was working in the garden but denies going into the woods.  He reports an area along his right arm, right rib cage and left rib cage which did swell mildly he reports itching.  Denies any fevers or chills at home.  Denies any open wound or drainage from any of these areas.  He is currently using over-the-counter creams as needed for itching and discomfort.  HPI  Past Medical History:  Diagnosis Date   Diabetes mellitus without complication (HCC)    Hypertension     Patient Active Problem List   Diagnosis Date Noted   Annual physical exam 03/23/2020   Hyperlipidemia associated with type 2 diabetes mellitus (HCC) 09/20/2019   Diabetes mellitus (HCC) 06/20/2019   Essential hypertension 06/20/2019   Overweight 06/20/2019    Past Surgical History:  Procedure Laterality Date   NO PAST SURGERIES         Home Medications    Prior to Admission medications   Medication Sig Start Date End Date Taking? Authorizing Provider  aspirin 81 MG chewable tablet Chew 1 tablet (81 mg total) by mouth daily. 02/04/21  Yes Flinchum, Eula Fried, FNP  atorvastatin (LIPITOR) 20 MG tablet Take 1 tablet (20 mg total) by mouth daily. 02/04/21  Yes Flinchum, Eula Fried, FNP  lisinopril (ZESTRIL) 20 MG tablet Take 1 tablet (20 mg total) by mouth daily. 02/04/21  Yes Flinchum, Eula Fried, FNP  metFORMIN (GLUCOPHAGE) 500 MG tablet Take 2 tablets (1,000 mg total) by mouth 2 (two) times daily with a meal. 02/04/21  Yes Flinchum, Eula Fried, FNP  methylPREDNISolone (MEDROL DOSEPAK) 4 MG TBPK tablet Take per package instructions 05/02/21  Yes Anson [place], PA-C     Family History Family History  Problem Relation Age of Onset   Hypertension Mother    Diabetes Mother    Diabetes Father    Hypertension Father    Kidney disease Father    Colon cancer Neg Hx    Prostate cancer Neg Hx    Breast cancer Neg Hx     Social History Social History   Tobacco Use   Smoking status: Former    Packs/day: 1.00    Years: 25.00    Pack years: 25.00    Types: Cigarettes    Quit date: 06/19/2014    Years since quitting: 6.8   Smokeless tobacco: Never  Vaping Use   Vaping Use: Never used  Substance Use Topics   Alcohol use: Yes    Alcohol/week: 2.0 standard drinks    Types: 2 Standard drinks or equivalent per week   Drug use: Never     Allergies   Patient has no known allergies.   Review of Systems Review of Systems  Skin:  Positive for rash.  All other systems reviewed and are negative.   Physical Exam Triage Vital Signs ED Triage Vitals  Enc Vitals Group     BP 05/02/21 1550 122/81     Pulse Rate 05/02/21 1550 87     Resp 05/02/21 1550 15     Temp 05/02/21 1550 98.6  F (37 C)     Temp Source 05/02/21 1550 Oral     SpO2 05/02/21 1550 99 %     Weight 05/02/21 1551 160 lb (72.6 kg)     Height 05/02/21 1551 5\' 6"  (1.676 m)     Head Circumference --      Peak Flow --      Pain Score 05/02/21 1551 0     Pain Loc --      Pain Edu? --      Excl. in GC? --    No data found.  Updated Vital Signs BP 122/81 (BP Location: Right Arm)   Pulse 87   Temp 98.6 F (37 C) (Oral)   Resp 15   Ht 5\' 6"  (1.676 m)   Wt 160 lb (72.6 kg)   SpO2 99%   BMI 25.82 kg/m   Visual Acuity Right Eye Distance:   Left Eye Distance:   Bilateral Distance:    Right Eye Near:   Left Eye Near:    Bilateral Near:     Physical Exam Skin examination right arm in addition to left rib cage and right rib cage demonstrate small welts with mild erythema.  No open wound or purulent drainage noted.  They do appear to be small bug bites, possible mosquito  bites.  There is no breakdown the skin.  No central area of necrosis.   UC Treatments / Results  Labs (all labs ordered are listed, but only abnormal results are displayed) Labs Reviewed - No data to display  EKG   Radiology No results found.  Procedures Procedures (including critical care time)  Medications Ordered in UC Medications - No data to display  Initial Impression / Assessment and Plan / UC Course  I have reviewed the triage vital signs and the nursing notes.  Pertinent labs & imaging results that were available during my care of the patient were reviewed by me and considered in my medical decision making (see chart for details).     1.  Treatment option were discussed today with the patient. 2.  The patient continues to experience itching, the patient was given a Medrol Dosepak to help with inflammation. 3.  Continue using over-the-counter creams as needed for comfort. 4.  Follow-up on as-needed basis. Final Clinical Impressions(s) / UC Diagnoses   Final diagnoses:  Contact dermatitis, unspecified contact dermatitis type, unspecified trigger     Discharge Instructions      -Take medication as prescribed. -After you shower make sure that your skin is dry. -Follow-up if areas of irritation fail to improve.   ED Prescriptions     Medication Sig Dispense Auth. Provider   methylPREDNISolone (MEDROL DOSEPAK) 4 MG TBPK tablet Take per package instructions 21 tablet 05/04/21, PA-C      PDMP not reviewed this encounter.   , PA-C 05/02/21 1743

## 2021-05-02 NOTE — ED Triage Notes (Signed)
Patient states that he has several mosquito like bites, wanted to make sure it wasn't anything further.

## 2021-05-18 ENCOUNTER — Telehealth: Payer: Self-pay

## 2021-05-18 NOTE — Telephone Encounter (Signed)
Spoke with patient on the phone who was seen at urgent care on 05/02/21( see note in chart)  with complaints of a rash at the time on his right arm and both side of ribs. Patient was prescribed Medrol, but states that symptoms never improved and states today that rash is all over his body. When I suggested patient return back to clinic for evaluation for worsening symptoms, he states that his mom had similar rash and was prescribed a cream that cleared her skin. Patient is wanting to know if you can prescribe medicine to treat. KW

## 2021-05-18 NOTE — Telephone Encounter (Signed)
Copied from CRM [id number]. Topic: General - Other >> May 18, 2021 12:26 PM Pawlus, Maxine Glenn A wrote: Reason for CRM: Pt went to urgent care on 6/19 for a rash, pt stated the rash has not gotten any better and he stated he is still concerned. Pt wanted some advice before his appt on 7/11 if possible.

## 2021-05-19 ENCOUNTER — Ambulatory Visit: Payer: Self-pay | Admitting: *Deleted

## 2021-05-19 NOTE — Telephone Encounter (Signed)
I tried to schedule patient and appt and he refused. I explained to him can't schedule this appt on the same day as his physical on 07/11.

## 2021-05-19 NOTE — Telephone Encounter (Signed)
Recommend visit to eval. Cannot prescribe without knowing what I'm treating

## 2021-05-19 NOTE — Telephone Encounter (Signed)
Pt reports "Bumps all over my body." States onset 04/30/21. Reports red, raised bumps, size of tip of pen, moist. Severe itching. Went to [facility] 04/30/21, given steroid pack "Now worsening." Went to UC yesterday, "Saying probably insects." States given steroid pack again "But I'm not taking because of my blood sugar and they didn't help before." States his mother stayed with him for 3 days and also developed same type rash. States "Her doctor put her on Valaciclovir, but not sure what he said rash was from."  States "If I sleep for 8 hours or more, itching is less." Does report rash had been painful, "Not so much now, itchy, I can't stop scratching." Pt requesting to be seen today. Call during lunch hour. Please advise: [phone]

## 2021-05-19 NOTE — Telephone Encounter (Signed)
Reason for Disposition  SEVERE itching (i.e., interferes with sleep, normal activities or school)  Answer Assessment - Initial Assessment Questions 1. APPEARANCE of RASH: "Describe the rash." (e.g., spots, blisters, raised areas, skin peeling, scaly)     Red raised bumps 2. SIZE: "How big are the spots?" (e.g., tip of pen, eraser, coin; inches, centimeters)     Tip of a pen 3. LOCATION: "Where is the rash located?"     "All over my body" 4. COLOR: "What color is the rash?" (Note: It is difficult to assess rash color in people with darker-colored skin. When this situation occurs, simply ask the caller to describe what they see.)     Red, moist 5. ONSET: "When did the rash begin?"     04/30/21 6. FEVER: "Do you have a fever?" If Yes, ask: "What is your temperature, how was it measured, and when did it start?"     no 7. ITCHING: "Does the rash itch?" If Yes, ask: "How bad is the itch?" (Scale 1-10; or mild, moderate, severe)     11/10 8. CAUSE: "What do you think is causing the rash?"     "UC said insect bites." 9. MEDICINE FACTORS: "Have you started any new medicines within the last 2 weeks?" (e.g., antibiotics)      No 10. OTHER SYMPTOMS: "Do you have any other symptoms?" (e.g., dizziness, headache, sore throat, joint pain)     "If I sleep more than 8 hours itching better."  Protocols used: Rash or Redness - [facility]

## 2021-05-20 NOTE — Telephone Encounter (Signed)
See other note where patient declined appt.

## 2021-05-20 NOTE — Telephone Encounter (Signed)
Noted  

## 2021-05-24 ENCOUNTER — Encounter: Payer: BLUE CROSS/BLUE SHIELD | Admitting: Family Medicine

## 2021-12-13 ENCOUNTER — Encounter: Payer: Self-pay | Admitting: Family Medicine
